# Patient Record
Sex: Male | Born: 1975 | Race: White | Hispanic: No | Smoking: Never smoker
Health system: Southern US, Community
[De-identification: ages and names within clinical notes are randomized; demographics above are authoritative.]

## PROBLEM LIST (undated history)

## (undated) DIAGNOSIS — F429 Obsessive-compulsive disorder, unspecified: Secondary | ICD-10-CM

## (undated) DIAGNOSIS — F32A Depression, unspecified: Secondary | ICD-10-CM

## (undated) DIAGNOSIS — R519 Headache, unspecified: Secondary | ICD-10-CM

## (undated) DIAGNOSIS — F419 Anxiety disorder, unspecified: Secondary | ICD-10-CM

## (undated) HISTORY — DX: Headache, unspecified: R51.9

## (undated) HISTORY — DX: Anxiety disorder, unspecified: F41.9

## (undated) HISTORY — PX: HERNIA REPAIR: SHX51

## (undated) HISTORY — DX: Depression, unspecified: F32.A

## (undated) HISTORY — DX: Obsessive-compulsive disorder, unspecified: F42.9

---

## 2005-11-12 ENCOUNTER — Emergency Department (HOSPITAL_COMMUNITY): Admission: EM | Admit: 2005-11-12 | Discharge: 2005-11-12 | Payer: Self-pay | Admitting: Emergency Medicine

## 2012-06-17 ENCOUNTER — Emergency Department (HOSPITAL_BASED_OUTPATIENT_CLINIC_OR_DEPARTMENT_OTHER)
Admission: EM | Admit: 2012-06-17 | Discharge: 2012-06-17 | Disposition: A | Discharge status: Home / Self Care | Payer: BC Managed Care – PPO | Attending: Emergency Medicine | Admitting: Emergency Medicine

## 2012-06-17 ENCOUNTER — Encounter (HOSPITAL_BASED_OUTPATIENT_CLINIC_OR_DEPARTMENT_OTHER): Payer: Self-pay | Admitting: *Deleted

## 2012-06-17 DIAGNOSIS — R109 Unspecified abdominal pain: Secondary | ICD-10-CM | POA: Insufficient documentation

## 2012-06-17 DIAGNOSIS — L089 Local infection of the skin and subcutaneous tissue, unspecified: Secondary | ICD-10-CM

## 2012-06-17 DIAGNOSIS — R1033 Periumbilical pain: Secondary | ICD-10-CM

## 2012-06-17 MED ORDER — CEPHALEXIN 500 MG PO CAPS: 500.0000 mg | ORAL_CAPSULE | Freq: Four times a day (QID) | ORAL | Status: AC

## 2012-06-17 NOTE — ED Notes (Signed)
MD at bedside. 

## 2012-06-17 NOTE — ED Provider Notes (Signed)
History     CSN: [id number]  Arrival date & time 06/17/12  1831   First MD Initiated Contact with Patient 06/17/12 1850      Chief Complaint  Patient presents with  . Abdominal Pain    (Consider location/radiation/quality/duration/timing/severity/associated sxs/prior treatment) HPI Patient presents with three-day history of umbilical pain discomfort.  He went to his local doctor who sent him here for evaluation for possible umbilical hernia.  Patient hasn't had no abdominal swelling.  He said normal bowel movements and normal flatus.  Patient denies fever or chills.  Denies dysuria.  Has no pertinent medical history.  His appetite has been good.  The pain does not increase with straining. History reviewed. No pertinent past medical history.  Past Surgical History  Procedure Date  . Hernia repair     No family history on file.  History  Substance Use Topics  . Smoking status: Never Smoker   . Smokeless tobacco: Not on file  . Alcohol Use: No      Review of Systems  All other systems reviewed and are negative.    Allergies  Review of patient's allergies indicates no known allergies.  Home Medications   Current Outpatient Rx  Name Route Sig Dispense Refill  . CEPHALEXIN 500 MG PO CAPS Oral Take 1 capsule (500 mg total) by mouth 4 (four) times daily. 28 capsule 0    Pulse 67  Temp 97.5 F (36.4 C) (Oral)  Resp 20  SpO2 99%  Physical Exam  Nursing note and vitals reviewed. Constitutional: He is oriented to person, place, and time. He appears well-developed. No distress.  HENT:  Head: Normocephalic and atraumatic.  Eyes: Pupils are equal, round, and reactive to light.  Neck: Normal range of motion.  Cardiovascular: Normal rate and intact distal pulses.   Pulmonary/Chest: No respiratory distress.  Abdominal: Soft. Normal appearance and bowel sounds are normal. He exhibits no distension and no mass. There is no tenderness. There is no rebound. No hernia.     Musculoskeletal: Normal range of motion.  Neurological: He is alert and oriented to person, place, and time. No cranial nerve deficit.  Skin: Skin is warm and dry. No rash noted.  Psychiatric: He has a normal mood and affect. His behavior is normal.    ED Course  Procedures (including critical care time)  Labs Reviewed - No data to display No results found.   1. Umbilical pain   2. Skin infection       MDM   Examination leads me to feel like this is a skin infection in his umbilicus rather than an umbilical hernia.  Plan at this time is to have him clean the area with peroxide twice daily along with topical per plan by appointment.  I am going to place him on antibiotics with strict instructions to return should his pain worsen or he develop vomiting or fever.       Nelia Shi, MD 06/17/12 Windell Moment

## 2012-06-17 NOTE — ED Notes (Signed)
Was sent from [facility] to r/o incarcerated umbilical hernia.  Pain in his abdomen x 3 days.

## 2013-06-28 ENCOUNTER — Ambulatory Visit (INDEPENDENT_AMBULATORY_CARE_PROVIDER_SITE_OTHER): Payer: BC Managed Care – PPO | Admitting: Surgery

## 2013-06-28 ENCOUNTER — Encounter (INDEPENDENT_AMBULATORY_CARE_PROVIDER_SITE_OTHER): Payer: Self-pay | Admitting: Surgery

## 2013-06-28 ENCOUNTER — Telehealth (INDEPENDENT_AMBULATORY_CARE_PROVIDER_SITE_OTHER): Payer: Self-pay | Admitting: General Surgery

## 2013-06-28 VITALS — BP 140/70 | HR 68 | Resp 16 | Ht 67.0 in | Wt 152.6 lb

## 2013-06-28 DIAGNOSIS — K645 Perianal venous thrombosis: Secondary | ICD-10-CM

## 2013-06-28 MED ORDER — LIDOCAINE HCL 2 % EX GEL: CUTANEOUS | Status: AC

## 2013-06-28 MED ORDER — OXYCODONE-ACETAMINOPHEN 5-325 MG PO TABS: 1.0000 | ORAL_TABLET | ORAL | Status: DC | PRN

## 2013-06-28 NOTE — Patient Instructions (Signed)
Hemorrhoidectomy °Care After °Hemorrhoidectomy is the removal of enlarged (dilated) veins around the rectum. Until the surgical areas are healed, control of pain and avoiding constipation are the greatest challenges for patients.  °For as long as 24 hours after receiving an anesthetic (the medication that made you sleep), and while taking narcotic pain relievers, you may feel dizzy, weak and drowsy. For that reason, the following information applies to the first 24-hour period following surgery, and continues for as long as you are taking narcotic pain medications. °· Do not drive a car, ride a bicycle, participate in activities in which you could be hurt. Do not take public transportation until you are off narcotic pain medications and until your caregiver says it is okay. °· Do not drink alcohol, take tranquilizers, or medications not prescribed or allowed by your surgical caregiver. °· Do not sign important papers or contracts for at least 24 hours or while taking narcotic medications. °· Have a responsible person with you for 24 hours. °RISKS AND COMPLICATIONS °Some problems that may occur following this procedure include: °· Infection. A germ starts growing in the tissue surrounding the site operated on. This can usually be treated with antibiotics. °· Damage to the rectal sphincter could occur. This is the muscle that opens in your anus to allow a bowel movement. This could cause incontinence. This is uncommon. °· Bleeding following surgery can be a complication of almost any surgery. Your surgeon takes every precaution to keep this from happening. °· Complications of anesthesia. °HOME CARE INSTRUCTIONS °· Avoid straining when having bowel movements. °· Avoid heavy lifting (more than 10 pounds (4.5 kilograms)). °· Only take over-the-counter or prescription medicines for pain, discomfort, or fever as directed by your caregiver. °· Take hot sitz baths for 20 to 30 minutes, 3 to 4 times per day. °· To keep  swelling down, apply an ice pack for twenty minutes three to four times per day between sitz baths. Use a towel between your skin and the ice pack. Do not do this if it causes too much discomfort. °· Keep anal area clean and dry. Following a bowel movement, you can gently wash the area with tucks (available for purchase at a drugstore) or cotton swabs. Gently pat the area dry. Do not rub the area. °· Eat a well balanced diet and drink 6 to 8 glasses of water every day to avoid constipation. A bulk laxative may be also be helpful. °SEEK MEDICAL CARE IF:  °· You have increasing pain or tenderness near or in the surgical site. °· You are unable to eat or drink. °· You develop nausea or vomiting. °· You develop uncontrolled bleeding such as soaking two to three pads in one hour. °· You have constipation, not helped by changing your diet or increasing your fluid intake. Pain medications are a common cause of constipation. °· You have pain and redness (inflammation) extending outside the area of your surgery. °· You develop an unexplained oral temperature above 102° F (38.9° C), or any other signs of infection. °· You have any other questions or concerns following surgery. °Document Released: 01/10/2004 Document Revised: 01/12/2012 Document Reviewed: 04/09/2009 °[employer]® Patient Information ©2014 [employer]. ° °

## 2013-06-28 NOTE — Progress Notes (Signed)
Subjective:     Patient ID: Roger Robertson, male   DOB: 11-03-1976, 37 y.o.   MRN: [id number]  HPIpatient presents today with thrombosed external hemorrhoid. He developed pain week ago in his anal canal. The pain worsened. It was associated with rectal bleeding. He was seen Friday at urgent care where he was diagnosed with a thrombosed external hemorrhoid and this was operated on the office. Over the weekend, he developed more pain, bleeding and swelling. He presents today for reevaluation of thrombosed external hemorrhoid.   Review of Systems  Gastrointestinal: Positive for anal bleeding and rectal pain.  Musculoskeletal: Negative.   Skin: Negative.   Hematological: Negative.        Objective:   Physical Exam  HENT:  Head: Normocephalic and atraumatic.  Genitourinary:     Skin: Skin is warm and dry.  Psychiatric: He has a normal mood and affect. His behavior is normal. Judgment and thought content normal.       Assessment:     Thrombosed external hemorrhoid status post failed incision and drainage    Plan:     Recommended excision of external hemorrhoid which is thrombosed. Pros, cons and alternative therapies discussed. Observation versus excision discussed. Risk of bleeding, infection, and other operations discussed. He wished to proceed. The patient was placed left side down. The complexes in the right lateral position. This was prepped with chlorhexidine. One percent lidocaine was used and 10 cc was injected around the complex. It was inflamed. The complex was excised in its entirety. Small clots were noted. The patient tolerated the procedure well. Instructions and medication prescriptions given. Return to work on Friday. Return to office as needed.

## 2013-06-28 NOTE — Telephone Encounter (Signed)
He call saying he was having poor pain control after excison of a thrombosed external hemorrhoid today.  He has been taking one oxycodone tablet every 4 hours.  I told him he could take two tablets every 3-4 hours, start taking Ibuprofen 600 mg every 6 hours, and put an ice pack on the area.  If this does not give him relief, I told him to call the office tomorrow morning.

## 2013-06-29 ENCOUNTER — Telehealth (INDEPENDENT_AMBULATORY_CARE_PROVIDER_SITE_OTHER): Payer: Self-pay

## 2013-06-29 NOTE — Telephone Encounter (Signed)
Pt called asking if he should still take 2 percocet every 3-4 hours along with 600 mg ibuprofen every 6 hours as directed by Dr. Abbey Chatters last night.  I suggested that if he is still in pain he should do so, as well as apply an ice pack to the area.  Pt inquired about an antibiotic just in case he develops an infection.  I let pt know that unless he is showing signs of infection we do not generally prescribe antibx for hem.  Told pt if he starts showing signs of infections such as fever or drainage to call our office for an urgent office appointment.  Pt verbalized understanding.

## 2014-01-13 ENCOUNTER — Encounter (INDEPENDENT_AMBULATORY_CARE_PROVIDER_SITE_OTHER): Payer: BC Managed Care – PPO | Admitting: Surgery

## 2016-08-21 ENCOUNTER — Encounter (HOSPITAL_COMMUNITY): Payer: Self-pay | Admitting: Emergency Medicine

## 2016-08-21 DIAGNOSIS — R51 Headache: Secondary | ICD-10-CM | POA: Insufficient documentation

## 2016-08-21 DIAGNOSIS — Z7982 Long term (current) use of aspirin: Secondary | ICD-10-CM | POA: Insufficient documentation

## 2016-08-21 LAB — CBC WITH DIFFERENTIAL/PLATELET
BASOS PCT: 0 %
Basophils Absolute: 0 10*3/uL (ref 0.0–0.1)
Eosinophils Absolute: 0.3 10*3/uL (ref 0.0–0.7)
Eosinophils Relative: 3 %
HEMATOCRIT: 44.5 % (ref 39.0–52.0)
HEMOGLOBIN: 15.6 g/dL (ref 13.0–17.0)
LYMPHS PCT: 34 %
Lymphs Abs: 2.7 10*3/uL (ref 0.7–4.0)
MCH: 30.8 pg (ref 26.0–34.0)
MCHC: 35.1 g/dL (ref 30.0–36.0)
MCV: 87.8 fL (ref 78.0–100.0)
MONOS PCT: 6 %
Monocytes Absolute: 0.5 10*3/uL (ref 0.1–1.0)
NEUTROS ABS: 4.5 10*3/uL (ref 1.7–7.7)
NEUTROS PCT: 57 %
Platelets: 280 10*3/uL (ref 150–400)
RBC: 5.07 MIL/uL (ref 4.22–5.81)
RDW: 12.9 % (ref 11.5–15.5)
WBC: 8 10*3/uL (ref 4.0–10.5)

## 2016-08-21 LAB — BASIC METABOLIC PANEL
ANION GAP: 6 (ref 5–15)
BUN: 21 mg/dL — ABNORMAL HIGH (ref 6–20)
CHLORIDE: 103 mmol/L (ref 101–111)
CO2: 29 mmol/L (ref 22–32)
CREATININE: 1.25 mg/dL — AB (ref 0.61–1.24)
Calcium: 9.6 mg/dL (ref 8.9–10.3)
GFR calc non Af Amer: >60 mL/min (ref >60)
Glucose, Bld: 92 mg/dL (ref 65–99)
Potassium: 4 mmol/L (ref 3.5–5.1)
Sodium: 138 mmol/L (ref 135–145)

## 2016-08-21 NOTE — ED Triage Notes (Signed)
Pt. reports intermittent headache with nausea for 3 weeks unrelieved by prescription Sumatripan , he has an appointment wit a neurologist in November.

## 2016-08-22 ENCOUNTER — Emergency Department (HOSPITAL_COMMUNITY)
Admission: EM | Admit: 2016-08-22 | Discharge: 2016-08-22 | Disposition: A | Discharge status: Home / Self Care | Payer: BLUE CROSS/BLUE SHIELD | Attending: Emergency Medicine | Admitting: Emergency Medicine

## 2016-08-22 ENCOUNTER — Emergency Department (HOSPITAL_COMMUNITY): Payer: BLUE CROSS/BLUE SHIELD

## 2016-08-22 DIAGNOSIS — R51 Headache: Secondary | ICD-10-CM

## 2016-08-22 DIAGNOSIS — R519 Headache, unspecified: Secondary | ICD-10-CM

## 2016-08-22 MED ORDER — DEXAMETHASONE SODIUM PHOSPHATE 10 MG/ML IJ SOLN
10.0000 mg | Freq: Once | INTRAMUSCULAR | Status: AC
  Administered 2016-08-22: 10 mg via INTRAVENOUS
  Filled 2016-08-22: qty 1

## 2016-08-22 MED ORDER — PROCHLORPERAZINE EDISYLATE 5 MG/ML IJ SOLN
10.0000 mg | Freq: Once | INTRAMUSCULAR | Status: AC
  Administered 2016-08-22: 10 mg via INTRAVENOUS
  Filled 2016-08-22: qty 2

## 2016-08-22 MED ORDER — SODIUM CHLORIDE 0.9 % IV BOLUS (SEPSIS)
1000.0000 mL | Freq: Once | INTRAVENOUS | Status: AC
  Administered 2016-08-22: 1000 mL via INTRAVENOUS

## 2016-08-22 MED ORDER — KETOROLAC TROMETHAMINE 30 MG/ML IJ SOLN
30.0000 mg | Freq: Once | INTRAMUSCULAR | Status: AC
  Administered 2016-08-22: 30 mg via INTRAVENOUS
  Filled 2016-08-22: qty 1

## 2016-08-22 MED ORDER — DIPHENHYDRAMINE HCL 50 MG/ML IJ SOLN
12.5000 mg | Freq: Once | INTRAMUSCULAR | Status: AC
  Administered 2016-08-22: 12.5 mg via INTRAVENOUS
  Filled 2016-08-22: qty 1

## 2016-08-22 NOTE — ED Notes (Signed)
Patient transported to CT 

## 2016-08-22 NOTE — ED Notes (Signed)
Pt returned from CT °

## 2016-08-22 NOTE — ED Provider Notes (Signed)
[facility]-EMERGENCY DEPT Provider Note   CSN: [id number] Arrival date & time: 08/21/16  2105     History   Chief Complaint Chief Complaint  Patient presents with  . Headache  . Nausea    HPI Roger Robertson is a 40 y.o. male with no major medical problems presents to the Emergency Department complaining of gradual, persistent,waxing and waning generalized headache onset 2 weeks ago.  Pt reports he does not regularly get headaches.  No diagnosis of migraine.  Pt denies vision changes. Associated symptoms include nausea.  Nothing makes it better and light makes it worse.  Pt denies fever, chills, neck pain, chest pain, abd pain, syncope.   Pt reports he has been seeing his PCP.  He was given sumatriptan which made the pain worse.  No CT has been performed.     The history is provided by the patient and medical records. No language interpreter was used.    History reviewed. No pertinent past medical history.  There are no active problems to display for this patient.   Past Surgical History:  Procedure Laterality Date  . HERNIA REPAIR         Home Medications    Prior to Admission medications   Medication Sig Start Date End Date Taking? Authorizing Provider  aspirin-acetaminophen-caffeine (EXCEDRIN MIGRAINE) [phone] MG tablet Take 1 tablet by mouth every 6 (six) hours as needed for headache.   Yes Historical Provider, MD  SUMAtriptan (IMITREX) 50 MG tablet Take 50 mg by mouth every 2 (two) hours as needed for migraine or headache.  08/19/16  Yes Historical Provider, MD    Family History No family history on file.  Social History Social History  Substance Use Topics  . Smoking status: Never Smoker  . Smokeless tobacco: Never Used  . Alcohol use No     Allergies   Review of patient's allergies indicates no known allergies.   Review of Systems Review of Systems  Neurological: Positive for headaches.  All other systems reviewed and are negative.    Physical  Exam Updated Vital Signs BP 143/95   Pulse 79   Temp 98.3 F (36.8 C) (Oral)   Resp 18   Ht 5\' 7"  (1.702 m)   Wt 78.9 kg   SpO2 99%   BMI 27.25 kg/m   Physical Exam  Constitutional: He is oriented to person, place, and time. He appears well-developed and well-nourished. No distress.  HENT:  Head: Normocephalic and atraumatic.  Mouth/Throat: Oropharynx is clear and moist.  Eyes: Conjunctivae and EOM are normal. Pupils are equal, round, and reactive to light. No scleral icterus.  No horizontal, vertical or rotational nystagmus  Neck: Normal range of motion. Neck supple.  Full active and passive ROM without pain No midline or paraspinal tenderness No nuchal rigidity or meningeal signs  Cardiovascular: Normal rate, regular rhythm and intact distal pulses.   Pulmonary/Chest: Effort normal and breath sounds normal. No respiratory distress. He has no wheezes. He has no rales.  Abdominal: Soft. Bowel sounds are normal. There is no tenderness. There is no rebound and no guarding.  Musculoskeletal: Normal range of motion.  Lymphadenopathy:    He has no cervical adenopathy.  Neurological: He is alert and oriented to person, place, and time. He has normal reflexes. No cranial nerve deficit. He exhibits normal muscle tone. Coordination normal.  Mental Status:  Alert, oriented, thought content appropriate. Speech fluent without evidence of aphasia. Able to follow 2 step commands without difficulty.  Cranial Nerves:  II:  Peripheral visual fields grossly normal, pupils equal, round, reactive to light III,IV, VI: ptosis not present, extra-ocular motions intact bilaterally  V,VII: smile symmetric, facial light touch sensation equal VIII: hearing grossly normal bilaterally  IX,X: midline uvula rise  XI: bilateral shoulder shrug equal and strong XII: midline tongue extension  Motor:  5/5 in upper and lower extremities bilaterally including strong and equal grip strength and  dorsiflexion/plantar flexion Sensory: Pinprick and light touch normal in all extremities.  Deep Tendon Reflexes: 2+ and symmetric  Cerebellar: normal finger-to-nose with bilateral upper extremities Gait: normal gait and balance CV: distal pulses palpable throughout   Skin: Skin is warm and dry. No rash noted. He is not diaphoretic.  Psychiatric: He has a normal mood and affect. His behavior is normal. Judgment and thought content normal.  Nursing note and vitals reviewed.    ED Treatments / Results  Labs (all labs ordered are listed, but only abnormal results are displayed) Labs Reviewed  BASIC METABOLIC PANEL - Abnormal; Notable for the following:       Result Value   BUN 21 (*)    Creatinine, Ser 1.25 (*)    All other components within normal limits  CBC WITH DIFFERENTIAL/PLATELET    EKG  EKG Interpretation None       Radiology Ct Head Wo Contrast  Result Date: 08/22/2016 CLINICAL DATA:  Initial evaluation for acute headache for 2 weeks. EXAM: CT HEAD WITHOUT CONTRAST TECHNIQUE: Contiguous axial images were obtained from the base of the skull through the vertex without intravenous contrast. COMPARISON:  None available. FINDINGS: Brain: No acute intracranial hemorrhage. No evidence for acute or subacute ischemia. No mass lesion, midline shift or mass effect. No hydrocephalus. No extra-axial fluid collection. Vascular: No hyperdense vessel. Skull: Scalp soft tissues within normal limits.  Calvarium intact. Sinuses/Orbits: Globes and orbital soft tissues within normal limits. Paranasal sinuses are clear. No mastoid effusion. IMPRESSION: Normal head CT.  No acute intracranial process identified. Electronically Signed   By: Rise Mu M.D.   On: 08/22/2016 05:05    Procedures Procedures (including critical care time)  Medications Ordered in ED Medications  ketorolac (TORADOL) 30 MG/ML injection 30 mg (not administered)  dexamethasone (DECADRON) injection 10 mg (not  administered)  sodium chloride 0.9 % bolus 1,000 mL (1,000 mLs Intravenous New Bag/Given 08/22/16 0350)  prochlorperazine (COMPAZINE) injection 10 mg (10 mg Intravenous Given 08/22/16 0351)  diphenhydrAMINE (BENADRYL) injection 12.5 mg (12.5 mg Intravenous Given 08/22/16 0351)     Initial Impression / Assessment and Plan / ED Course  I have reviewed the triage vital signs and the nursing notes.  Pertinent labs & imaging results that were available during my care of the patient were reviewed by me and considered in my medical decision making (see chart for details).  Clinical Course  Value Comment By Time  WBC: 8.0 Screening labs reassuring. Creatinine slightly elevated at 1.25.  No old value for comparison. Dahlia Client Sonnie Pawloski, PA-C 10/20 0541  CT Head Wo Contrast No acute abnormality. No masses.   [facility]ia Client Derriana Oser, PA-C 10/20 0542   Pt reports his headache is better. [facility]ia Client Krishana Lutze, PA-C 10/20 0542   Pt with headache.  Pt HA treated and improved while in ED.  Presentation is non concerning for [facility], ICH, Meningitis, or temporal arteritis. Pt is afebrile with no focal neuro deficits, nuchal rigidity, or change in vision. Discussed normal CT scan findings with patient. Pt is to follow up with PCP to discuss further medication and  testing. Pt verbalizes understanding and is agreeable with plan to dc.    Final Clinical Impressions(s) / ED Diagnoses   Final diagnoses:  Nonintractable headache, unspecified chronicity pattern, unspecified headache type    New Prescriptions Current Discharge Medication List       Dierdre ForthHannah Barbee Mamula, PA-C 08/22/16 0544    Layla MawKristen N Ward, DO 08/22/16 [phone]

## 2016-08-22 NOTE — ED Notes (Signed)
Patient ambulated to BR with even, steady gait.

## 2017-09-21 IMAGING — CT CT HEAD W/O CM
4 series · 16 of 47 positions shown, 18 images · non-contrast
Comparison: None available.

CLINICAL DATA: Initial evaluation for acute headache for 2 weeks.

EXAM:
CT HEAD WITHOUT CONTRAST
TECHNIQUE: Contiguous axial images were obtained from the base of the skull
through the vertex without intravenous contrast.

[Series 2: head without · axial · non-contrast · 0.45mm/px · z∈[-113,+2]mm · 7 of 31 slices shown, 9 images]
[im 4/31  brain]
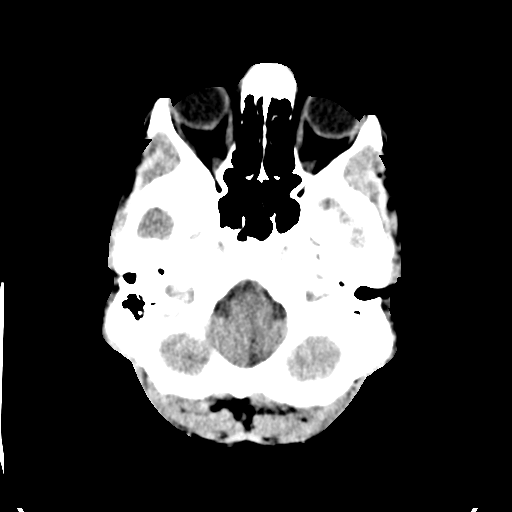
[im 4/31  bone]
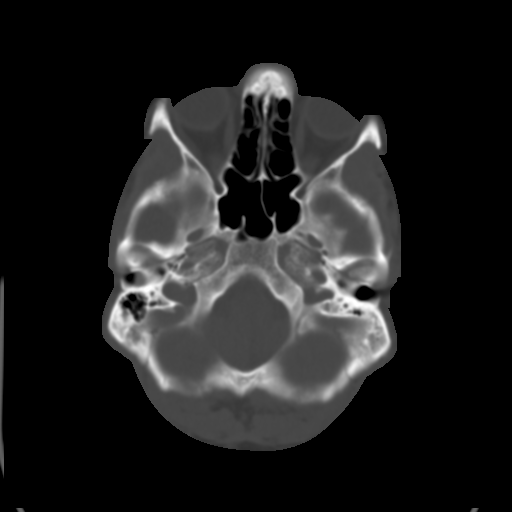
[im 8/31  brain]
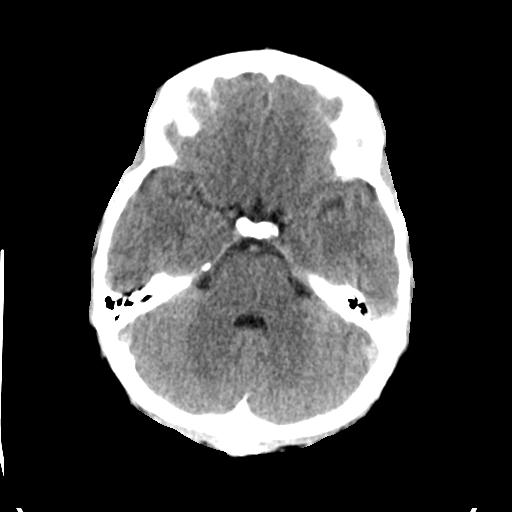
[im 12/31  brain]
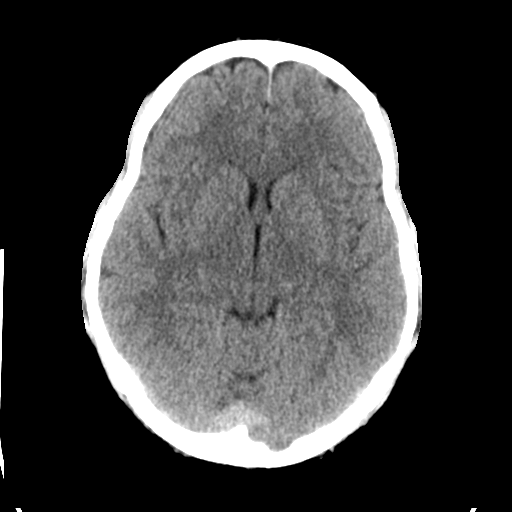
[im 16/31  brain]
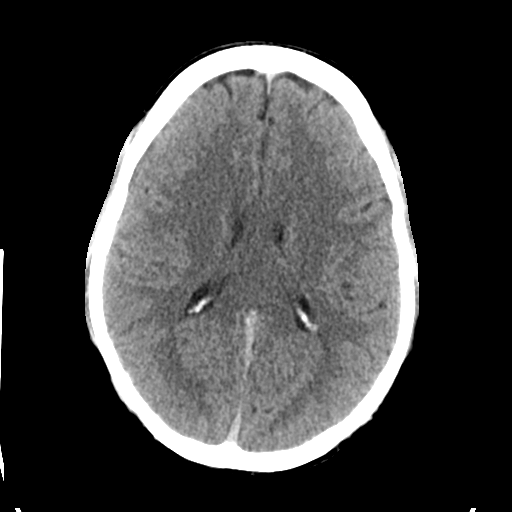
[im 19/31  brain]
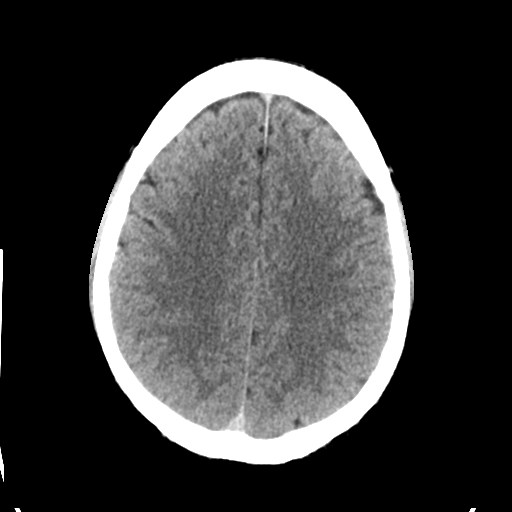
[im 19/31  bone]
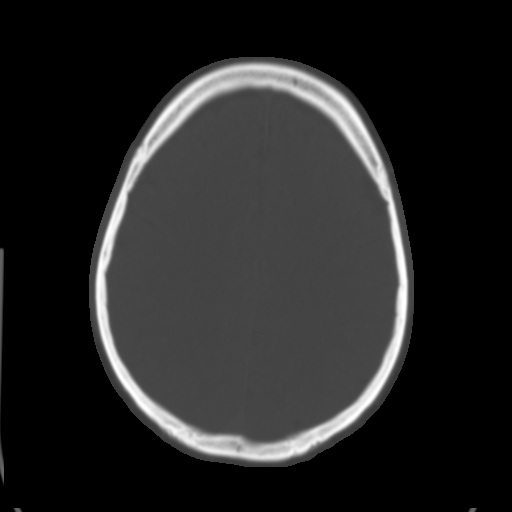
[im 23/31  brain]
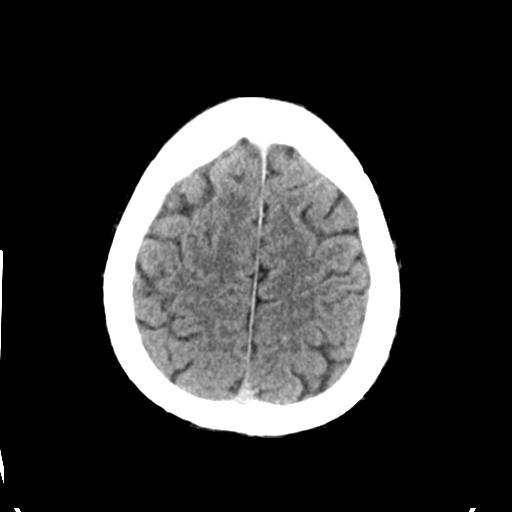
[im 27/31  brain]
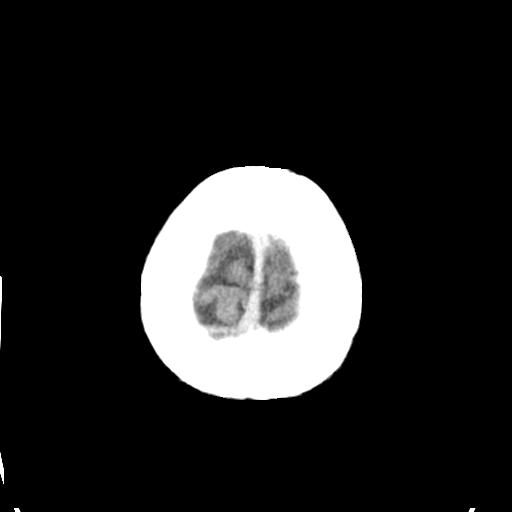

[Series 3: head bone · axial · 0.45mm/px · z∈[-114,-84]mm · 3 of 77 slices shown]
[im 8/77  bone]
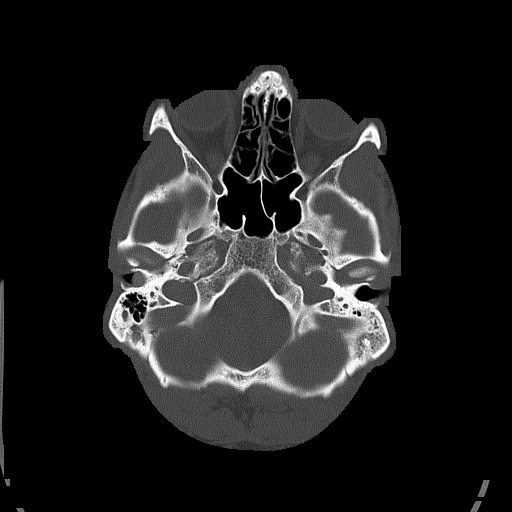
[im 16/77  bone]
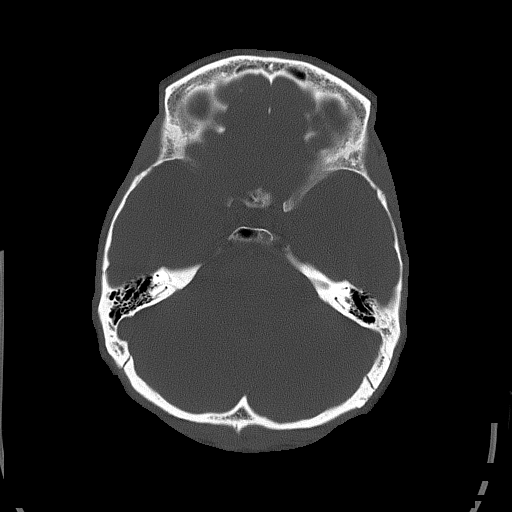
[im 23/77  bone]
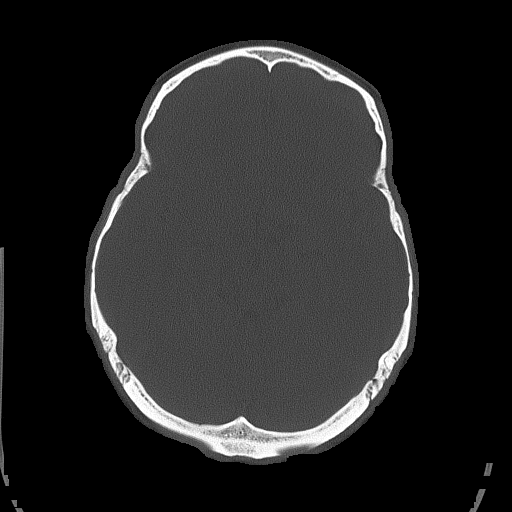

[Series 4: head without cor · coronal · non-contrast · 0.32mm/px · 3 of 66 slices shown]
[im 22/66  brain]
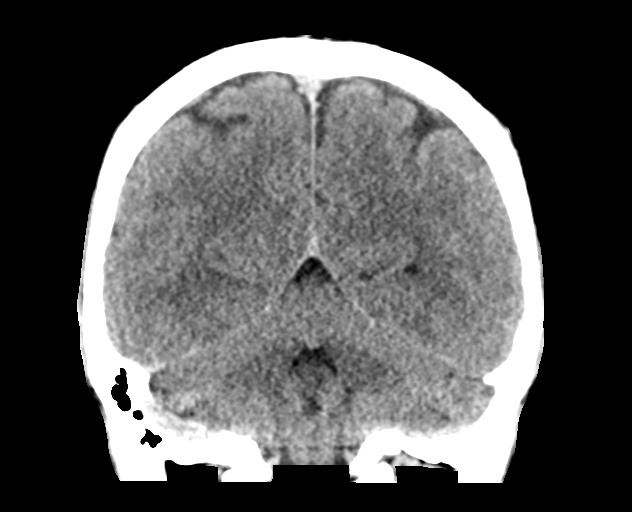
[im 29/66  brain]
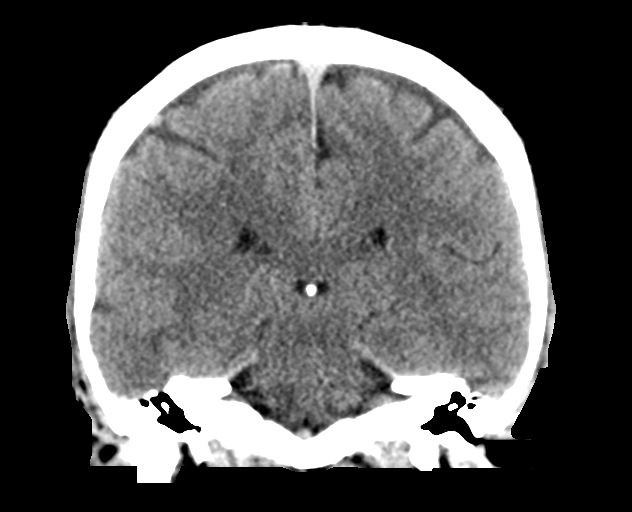
[im 37/66  brain]
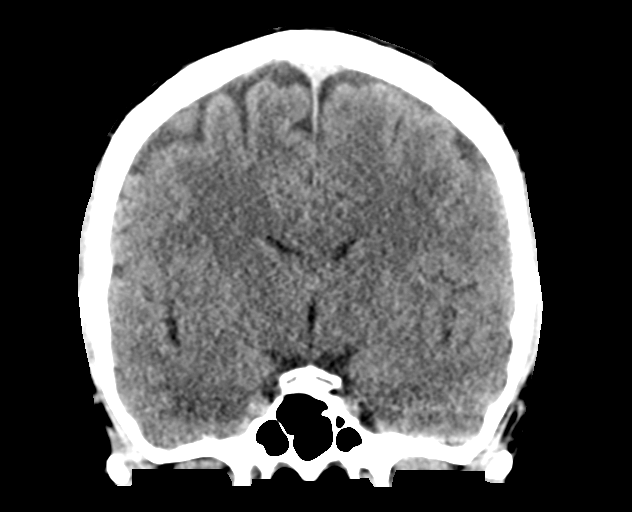

[Series 5: head without sag · sagittal · non-contrast · 0.33mm/px · 3 of 52 slices shown]
[im 18/52  brain]
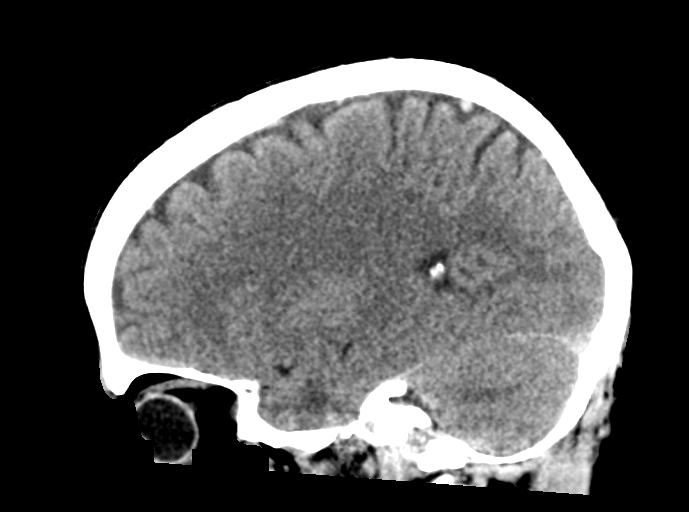
[im 26/52  brain]
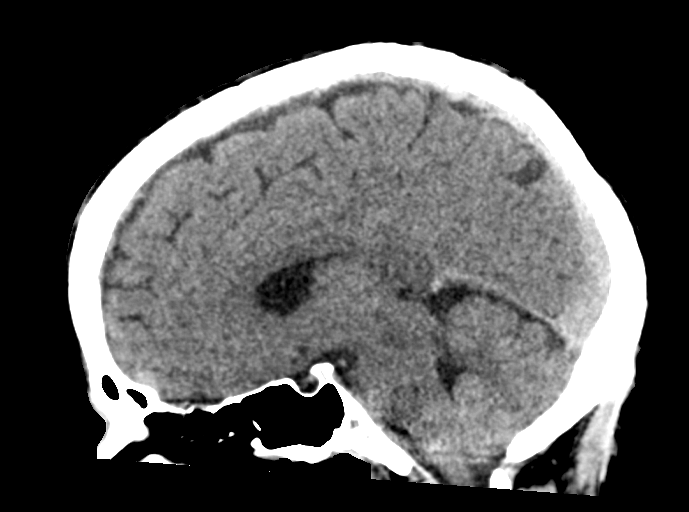
[im 35/52  brain]
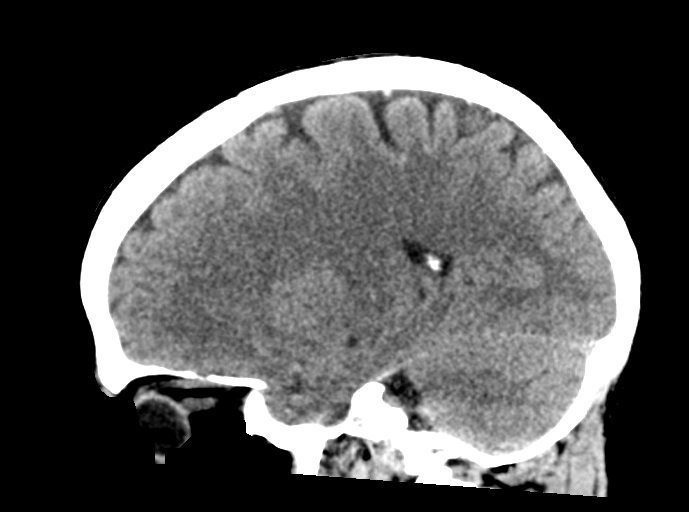

[16 of 47 positions shown; findings below may reference images not displayed]

FINDINGS: Brain: No acute intracranial hemorrhage. No evidence for acute or
subacute ischemia. No mass lesion, midline shift or mass effect. No
hydrocephalus. No extra-axial fluid collection.

Vascular: No hyperdense vessel.

Skull: Scalp soft tissues within normal limits.  Calvarium intact.

Sinuses/Orbits: Globes and orbital soft tissues within normal
limits. Paranasal sinuses are clear. No mastoid effusion.
IMPRESSION: Normal head CT.  No acute intracranial process identified.

## 2020-06-06 ENCOUNTER — Other Ambulatory Visit: Payer: Self-pay

## 2020-06-06 ENCOUNTER — Encounter (HOSPITAL_BASED_OUTPATIENT_CLINIC_OR_DEPARTMENT_OTHER): Payer: Self-pay | Admitting: *Deleted

## 2020-06-06 ENCOUNTER — Emergency Department (HOSPITAL_BASED_OUTPATIENT_CLINIC_OR_DEPARTMENT_OTHER)
Admission: EM | Admit: 2020-06-06 | Discharge: 2020-06-06 | Disposition: A | Discharge status: Home / Self Care | Payer: BC Managed Care – PPO | Attending: Emergency Medicine | Admitting: Emergency Medicine

## 2020-06-06 DIAGNOSIS — R05 Cough: Secondary | ICD-10-CM | POA: Insufficient documentation

## 2020-06-06 DIAGNOSIS — Z7982 Long term (current) use of aspirin: Secondary | ICD-10-CM | POA: Diagnosis not present

## 2020-06-06 DIAGNOSIS — Z79899 Other long term (current) drug therapy: Secondary | ICD-10-CM | POA: Insufficient documentation

## 2020-06-06 DIAGNOSIS — U071 COVID-19: Secondary | ICD-10-CM | POA: Insufficient documentation

## 2020-06-06 DIAGNOSIS — R197 Diarrhea, unspecified: Secondary | ICD-10-CM | POA: Diagnosis not present

## 2020-06-06 DIAGNOSIS — R112 Nausea with vomiting, unspecified: Secondary | ICD-10-CM | POA: Diagnosis present

## 2020-06-06 DIAGNOSIS — R519 Headache, unspecified: Secondary | ICD-10-CM | POA: Insufficient documentation

## 2020-06-06 LAB — COMPREHENSIVE METABOLIC PANEL
ALT: 51 U/L — ABNORMAL HIGH (ref 0–44)
AST: 49 U/L — ABNORMAL HIGH (ref 15–41)
Albumin: 3.8 g/dL (ref 3.5–5.0)
Alkaline Phosphatase: 84 U/L (ref 38–126)
Anion gap: 11 (ref 5–15)
BUN: 13 mg/dL (ref 6–20)
CO2: 25 mmol/L (ref 22–32)
Calcium: 8.4 mg/dL — ABNORMAL LOW (ref 8.9–10.3)
Chloride: 101 mmol/L (ref 98–111)
Creatinine, Ser: 1.12 mg/dL (ref 0.61–1.24)
GFR calc Af Amer: >60 mL/min (ref >60)
GFR calc non Af Amer: >60 mL/min (ref >60)
Glucose, Bld: 100 mg/dL — ABNORMAL HIGH (ref 70–99)
Potassium: 4.1 mmol/L (ref 3.5–5.1)
Sodium: 137 mmol/L (ref 135–145)
Total Bilirubin: 0.5 mg/dL (ref 0.3–1.2)
Total Protein: 7.1 g/dL (ref 6.5–8.1)

## 2020-06-06 LAB — CBC WITH DIFFERENTIAL/PLATELET
Abs Immature Granulocytes: 0.01 10*3/uL (ref 0.00–0.07)
Basophils Absolute: 0 10*3/uL (ref 0.0–0.1)
Basophils Relative: 1 %
Eosinophils Absolute: 0 10*3/uL (ref 0.0–0.5)
Eosinophils Relative: 0 %
HCT: 48.5 % (ref 39.0–52.0)
Hemoglobin: 16.6 g/dL (ref 13.0–17.0)
Immature Granulocytes: 0 %
Lymphocytes Relative: 30 %
Lymphs Abs: 1.1 10*3/uL (ref 0.7–4.0)
MCH: 30.3 pg (ref 26.0–34.0)
MCHC: 34.2 g/dL (ref 30.0–36.0)
MCV: 88.5 fL (ref 80.0–100.0)
Monocytes Absolute: 0.5 10*3/uL (ref 0.1–1.0)
Monocytes Relative: 14 %
Neutro Abs: 2 10*3/uL (ref 1.7–7.7)
Neutrophils Relative %: 55 %
Platelets: 191 10*3/uL (ref 150–400)
RBC: 5.48 MIL/uL (ref 4.22–5.81)
RDW: 12.9 % (ref 11.5–15.5)
WBC: 3.5 10*3/uL — ABNORMAL LOW (ref 4.0–10.5)
nRBC: 0 % (ref 0.0–0.2)

## 2020-06-06 MED ORDER — DIPHENHYDRAMINE HCL 50 MG/ML IJ SOLN
25.0000 mg | Freq: Once | INTRAMUSCULAR | Status: AC
  Administered 2020-06-06: 25 mg via INTRAVENOUS
  Filled 2020-06-06: qty 1

## 2020-06-06 MED ORDER — SODIUM CHLORIDE 0.9 % IV BOLUS
1000.0000 mL | Freq: Once | INTRAVENOUS | Status: AC
Start: 2020-06-06 — End: 2020-06-06
  Administered 2020-06-06: 1000 mL via INTRAVENOUS

## 2020-06-06 MED ORDER — ONDANSETRON HCL 4 MG PO TABS: 4.0000 mg | ORAL_TABLET | Freq: Four times a day (QID) | ORAL | 0 refills | Status: AC | PRN

## 2020-06-06 MED ORDER — LACTATED RINGERS IV BOLUS
1000.0000 mL | Freq: Once | INTRAVENOUS | Status: AC
  Administered 2020-06-06: 1000 mL via INTRAVENOUS

## 2020-06-06 MED ORDER — KETOROLAC TROMETHAMINE 15 MG/ML IJ SOLN
15.0000 mg | Freq: Once | INTRAMUSCULAR | Status: AC
  Administered 2020-06-06: 15 mg via INTRAVENOUS
  Filled 2020-06-06: qty 1

## 2020-06-06 MED ORDER — METOCLOPRAMIDE HCL 5 MG/ML IJ SOLN
10.0000 mg | Freq: Once | INTRAMUSCULAR | Status: AC
  Administered 2020-06-06: 10 mg via INTRAVENOUS
  Filled 2020-06-06: qty 2

## 2020-06-06 NOTE — ED Provider Notes (Signed)
[facility] EMERGENCY DEPARTMENT Provider Note   CSN: [id number] Arrival date & time: 06/06/20  1336     History Chief Complaint  Patient presents with  . Emesis    covid +    Roger Robertson is a 44 y.o. male.  HPI   44yM with n/v/d. Recent COVID diagnosis. Symptoms began 8d ago with headache. HA has persisted. In the last few days has also had persistent n/v and loose stools. Occasional cough but doesn't feel SOB. Nonsmoker. Denies underlying medical history.   History reviewed. No pertinent past medical history.  There are no problems to display for this patient.   Past Surgical History:  Procedure Laterality Date  . HERNIA REPAIR         History reviewed. No pertinent family history.  Social History   Tobacco Use  . Smoking status: Never Smoker  . Smokeless tobacco: Never Used  Substance Use Topics  . Alcohol use: No  . Drug use: No    Home Medications Prior to Admission medications   Medication Sig Start Date End Date Taking? Authorizing Provider  aspirin-acetaminophen-caffeine (EXCEDRIN MIGRAINE) [phone] MG tablet Take 1 tablet by mouth every 6 (six) hours as needed for headache.    [provider]  ondansetron (ZOFRAN) 4 MG tablet Take 1 tablet (4 mg total) by mouth every 6 (six) hours as needed for nausea or vomiting. 06/06/20   Raeford Razor, MD  SUMAtriptan (IMITREX) 50 MG tablet Take 50 mg by mouth every 2 (two) hours as needed for migraine or headache.  08/19/16   [provider]    Allergies    Patient has no known allergies.  Review of Systems   Review of Systems All systems reviewed and negative, other than as noted in HPI.  Physical Exam Updated Vital Signs BP 122/75 (BP Location: Right Arm)   Pulse 86   Temp 98.2 F (36.8 C)   Resp 17   Ht 5\' 7"  (1.702 m)   Wt 79.4 kg   SpO2 99%   BMI 27.41 kg/m   Physical Exam Vitals and nursing note reviewed.  Constitutional:      General: He is not in acute  distress.    Appearance: He is well-developed.  HENT:     Head: Normocephalic and atraumatic.  Eyes:     General:        Right eye: No discharge.        Left eye: No discharge.     Conjunctiva/sclera: Conjunctivae normal.  Cardiovascular:     Rate and Rhythm: Normal rate and regular rhythm.     Heart sounds: Normal heart sounds. No murmur heard.  No friction rub. No gallop.   Pulmonary:     Effort: Pulmonary effort is normal. No respiratory distress.     Breath sounds: Normal breath sounds.  Abdominal:     General: There is no distension.     Palpations: Abdomen is soft.     Tenderness: There is no abdominal tenderness.  Musculoskeletal:        General: No tenderness.     Cervical back: Neck supple.  Skin:    General: Skin is warm and dry.  Neurological:     Mental Status: He is alert.  Psychiatric:        Behavior: Behavior normal.        Thought Content: Thought content normal.     ED Results / Procedures / Treatments   Labs (all labs ordered are listed, but  only abnormal results are displayed) Labs Reviewed  CBC WITH DIFFERENTIAL/PLATELET - Abnormal; Notable for the following components:      Result Value   WBC 3.5 (*)    All other components within normal limits  COMPREHENSIVE METABOLIC PANEL - Abnormal; Notable for the following components:   Glucose, Bld 100 (*)    Calcium 8.4 (*)    AST 49 (*)    ALT 51 (*)    All other components within normal limits    EKG None  Radiology No results found.  Procedures Procedures (including critical care time)  Medications Ordered in ED Medications  sodium chloride 0.9 % bolus 1,000 mL (0 mLs Intravenous Stopped 06/06/20 1500)  lactated ringers bolus 1,000 mL (0 mLs Intravenous Stopped 06/06/20 2115)  ketorolac (TORADOL) 15 MG/ML injection 15 mg (15 mg Intravenous Given 06/06/20 2001)  metoCLOPramide (REGLAN) injection 10 mg (10 mg Intravenous Given 06/06/20 1959)  diphenhydrAMINE (BENADRYL) injection 25 mg (25 mg  Intravenous Given 06/06/20 2000)    ED Course  I have reviewed the triage vital signs and the nursing notes.  Pertinent labs & imaging results that were available during my care of the patient were reviewed by me and considered in my medical decision making (see chart for details).    MDM Rules/Calculators/A&P                          44yM with recent COVID diagnosis with predominantly GI symptoms. Nontoxic. Labs reassuring. Mild leukopenia and minimal elevation in LFTs but this is not surprising and expected to be transient. HD stable. Treated symptomatically. Continued quarantine, rest and symptomatic tx.   Final Clinical Impression(s) / ED Diagnoses Final diagnoses:  COVID-19 virus infection    Rx / DC Orders ED Discharge Orders         Ordered    ondansetron (ZOFRAN) 4 MG tablet  Every 6 hours PRN     Discontinue  Reprint     06/06/20 2110           Raeford Razor, MD 06/06/20 2120

## 2020-06-06 NOTE — ED Notes (Signed)
ED Provider at bedside. 

## 2020-06-06 NOTE — ED Notes (Signed)
Pt ambulated to bathroom without difficulty.

## 2020-06-06 NOTE — ED Triage Notes (Signed)
Pt c/o n/v/d x 5 days COvid +

## 2020-06-06 NOTE — Discharge Instructions (Signed)
Take zofran as needed for nausea. You can take imodium as needed for diarrhea. Try to rest and stay well hydrated.

## 2020-09-01 ENCOUNTER — Emergency Department (HOSPITAL_BASED_OUTPATIENT_CLINIC_OR_DEPARTMENT_OTHER)
Admission: EM | Admit: 2020-09-01 | Discharge: 2020-09-01 | Disposition: A | Discharge status: Home / Self Care | Payer: No Typology Code available for payment source | Attending: Emergency Medicine | Admitting: Emergency Medicine

## 2020-09-01 ENCOUNTER — Emergency Department (HOSPITAL_BASED_OUTPATIENT_CLINIC_OR_DEPARTMENT_OTHER): Payer: No Typology Code available for payment source

## 2020-09-01 ENCOUNTER — Other Ambulatory Visit: Payer: Self-pay

## 2020-09-01 ENCOUNTER — Encounter (HOSPITAL_BASED_OUTPATIENT_CLINIC_OR_DEPARTMENT_OTHER): Payer: Self-pay | Admitting: Emergency Medicine

## 2020-09-01 DIAGNOSIS — R109 Unspecified abdominal pain: Secondary | ICD-10-CM

## 2020-09-01 DIAGNOSIS — R1031 Right lower quadrant pain: Secondary | ICD-10-CM | POA: Insufficient documentation

## 2020-09-01 LAB — CBC WITH DIFFERENTIAL/PLATELET
Abs Immature Granulocytes: 0.02 10*3/uL (ref 0.00–0.07)
Basophils Absolute: 0 10*3/uL (ref 0.0–0.1)
Basophils Relative: 1 %
Eosinophils Absolute: 0.1 10*3/uL (ref 0.0–0.5)
Eosinophils Relative: 2 %
HCT: 45.8 % (ref 39.0–52.0)
Hemoglobin: 15.5 g/dL (ref 13.0–17.0)
Immature Granulocytes: 0 %
Lymphocytes Relative: 33 %
Lymphs Abs: 1.8 10*3/uL (ref 0.7–4.0)
MCH: 31.1 pg (ref 26.0–34.0)
MCHC: 33.8 g/dL (ref 30.0–36.0)
MCV: 91.8 fL (ref 80.0–100.0)
Monocytes Absolute: 0.6 10*3/uL (ref 0.1–1.0)
Monocytes Relative: 10 %
Neutro Abs: 3.1 10*3/uL (ref 1.7–7.7)
Neutrophils Relative %: 54 %
Platelets: 316 10*3/uL (ref 150–400)
RBC: 4.99 MIL/uL (ref 4.22–5.81)
RDW: 13.8 % (ref 11.5–15.5)
WBC: 5.6 10*3/uL (ref 4.0–10.5)
nRBC: 0 % (ref 0.0–0.2)

## 2020-09-01 LAB — COMPREHENSIVE METABOLIC PANEL
ALT: 44 U/L (ref 0–44)
AST: 26 U/L (ref 15–41)
Albumin: 3.9 g/dL (ref 3.5–5.0)
Alkaline Phosphatase: 49 U/L (ref 38–126)
Anion gap: 8 (ref 5–15)
BUN: 11 mg/dL (ref 6–20)
CO2: 25 mmol/L (ref 22–32)
Calcium: 9 mg/dL (ref 8.9–10.3)
Chloride: 104 mmol/L (ref 98–111)
Creatinine, Ser: 1.06 mg/dL (ref 0.61–1.24)
GFR, Estimated: >60 mL/min (ref >60)
Glucose, Bld: 90 mg/dL (ref 70–99)
Potassium: 4.5 mmol/L (ref 3.5–5.1)
Sodium: 137 mmol/L (ref 135–145)
Total Bilirubin: <0.1 mg/dL — ABNORMAL LOW (ref 0.3–1.2)
Total Protein: 6.9 g/dL (ref 6.5–8.1)

## 2020-09-01 LAB — URINALYSIS, ROUTINE W REFLEX MICROSCOPIC
Bilirubin Urine: NEGATIVE
Glucose, UA: NEGATIVE mg/dL
Hgb urine dipstick: NEGATIVE
Ketones, ur: NEGATIVE mg/dL
Leukocytes,Ua: NEGATIVE
Nitrite: NEGATIVE
Protein, ur: NEGATIVE mg/dL
Specific Gravity, Urine: 1.02 (ref 1.005–1.030)
pH: 6 (ref 5.0–8.0)

## 2020-09-01 LAB — LIPASE, BLOOD: Lipase: 31 U/L (ref 11–51)

## 2020-09-01 MED ORDER — IOHEXOL 300 MG/ML  SOLN
100.0000 mL | Freq: Once | INTRAMUSCULAR | Status: AC | PRN
  Administered 2020-09-01: 100 mL via INTRAVENOUS

## 2020-09-01 NOTE — ED Provider Notes (Signed)
[facility] EMERGENCY DEPARTMENT Provider Note   CSN: [id number] Arrival date & time: 09/01/20  8657     History Chief Complaint  Patient presents with  . Abdominal Pain    Roger Robertson is a 44 y.o. male with past medical history of hernia surgically repaired in 1984.  HPI Patient presents to emergency department today with chief complaint of right lower quadrant abdominal pain x1 week.  Patient states the pain has been constant and is worse with any kind of movement, especially changing positions.  Patient describes the pain as aching.  Pain does not radiate.  At rest he states pain is 4 /10 in severity never with any movement it increases to 10 /10.  He has been taking ibuprofen at home without any symptom improvement.  He does state that he started out the [employer] x1 week ago.  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  He states the pain started while he was sitting on the couch over the weekend, not while doing any exercising.  He denies any fever, chills, chest pain, back pain, testicle or scrotal pain, testicle or scrotal swelling, pain on discharge, nausea, emesis, gross hematuria, urinary frequency, dysuria, history of constipation.  His last bowel movement was this morning and was normal.  He went to [place] prior to arrival and was sent to ED for further evaluation of pain.    History reviewed. No pertinent past medical history.  There are no problems to display for this patient.   Past Surgical History:  Procedure Laterality Date  . HERNIA REPAIR         No family history on file.  Social History   Tobacco Use  . Smoking status: Never Smoker  . Smokeless tobacco: Never Used  Vaping Use  . Vaping Use: Never used  Substance Use Topics  . Alcohol use: No  . Drug use: No    Home Medications Prior to Admission medications   Medication Sig Start Date End Date Taking? Authorizing Provider  aspirin-acetaminophen-caffeine  (EXCEDRIN MIGRAINE) [phone] MG tablet Take 1 tablet by mouth every 6 (six) hours as needed for headache.    [provider]  ondansetron (ZOFRAN) 4 MG tablet Take 1 tablet (4 mg total) by mouth every 6 (six) hours as needed for nausea or vomiting. 06/06/20   Raeford Razor, MD  SUMAtriptan (IMITREX) 50 MG tablet Take 50 mg by mouth every 2 (two) hours as needed for migraine or headache.  08/19/16   [provider]    Allergies    Patient has no known allergies.  Review of Systems   Review of Systems All other systems are reviewed and are negative for acute change except as noted in the HPI.  Physical Exam Updated Vital Signs BP 131/86 (BP Location: Left Arm)   Pulse 69   Temp 97.8 F (36.6 C) (Oral)   Resp 18   Ht 5\' 7"  (1.702 m)   Wt 79.8 kg   SpO2 100%   BMI 27.57 kg/m   Physical Exam Vitals and nursing note reviewed.  Constitutional:      General: He is not in acute distress.    Appearance: He is not ill-appearing.  HENT:     Head: Normocephalic and atraumatic.     Right Ear: Tympanic membrane and external ear normal.     Left Ear: Tympanic membrane and external ear normal.     Nose: Nose normal.     Mouth/Throat:  Mouth: Mucous membranes are moist.     Pharynx: Oropharynx is clear.  Eyes:     General: No scleral icterus.       Right eye: No discharge.        Left eye: No discharge.     Extraocular Movements: Extraocular movements intact.     Conjunctiva/sclera: Conjunctivae normal.     Pupils: Pupils are equal, round, and reactive to light.  Neck:     Vascular: No JVD.  Cardiovascular:     Rate and Rhythm: Normal rate and regular rhythm.     Pulses: Normal pulses.          Radial pulses are 2+ on the right side and 2+ on the left side.     Heart sounds: Normal heart sounds.  Pulmonary:     Comments: Lungs clear to auscultation in all fields. Symmetric chest rise. No wheezing, rales, or rhonchi. Abdominal:     General: Bowel sounds are  normal.     Tenderness: There is abdominal tenderness in the right lower quadrant. There is no right CVA tenderness or left CVA tenderness.     Comments: Abdomen is soft, non-distended. Voluntary guarding when palpating RLQ. No rigidity. No peritoneal signs.  Musculoskeletal:        General: Normal range of motion.     Cervical back: Normal range of motion.     Comments: Full range of motion of the T-spine and L-spine No tenderness to palpation of the spinous processes of the T-spine or L-spine No crepitus, deformity or step-offs No tenderness to palpation of the paraspinous muscles of the L-spine     Skin:    General: Skin is warm and dry.     Capillary Refill: Capillary refill takes less than 2 seconds.  Neurological:     Mental Status: He is oriented to person, place, and time.     GCS: GCS eye subscore is 4. GCS verbal subscore is 5. GCS motor subscore is 6.     Comments: Fluent speech, no facial droop.  Psychiatric:        Behavior: Behavior normal.     ED Results / Procedures / Treatments   Labs (all labs ordered are listed, but only abnormal results are displayed) Labs Reviewed  COMPREHENSIVE METABOLIC PANEL - Abnormal; Notable for the following components:      Result Value   Total Bilirubin <0.1 (*)    All other components within normal limits  CBC WITH DIFFERENTIAL/PLATELET  LIPASE, BLOOD  URINALYSIS, ROUTINE W REFLEX MICROSCOPIC    EKG None  Radiology CT ABDOMEN PELVIS W CONTRAST  Result Date: 09/01/2020 CLINICAL DATA:  44 year old with right lower quadrant abdominal pain. EXAM: CT ABDOMEN AND PELVIS WITH CONTRAST TECHNIQUE: Multidetector CT imaging of the abdomen and pelvis was performed using the standard protocol following bolus administration of intravenous contrast. CONTRAST:  OMNIPAQUE IOHEXOL 300 MG/ML  SOLN COMPARISON:  None. FINDINGS: Lower chest: Lung bases are clear. Hepatobiliary: No focal liver abnormality is seen. No gallbladder wall  thickening or biliary dilatation. Pancreas: Unremarkable. No pancreatic ductal dilatation or surrounding inflammatory changes. Spleen: Normal in size without focal abnormality. Adrenals/Urinary Tract: Normal appearance of the adrenal glands. Probable tiny cyst in left kidney lower pole. No hydronephrosis. No suspicious renal lesions. Stomach/Bowel: Stomach is within normal limits. Appendix appears normal. No evidence of bowel wall thickening, distention, or inflammatory changes. Vascular/Lymphatic: No significant vascular findings are present. No enlarged abdominal or pelvic lymph nodes. Reproductive: Prostate contains a few  calcifications. No significant enlargement. Other: Negative for ascites.  Negative for free air. Musculoskeletal: No acute abnormality. IMPRESSION: No acute abnormality in the abdomen or pelvis. Electronically Signed   By: Richarda Overlie M.D.   On: 09/01/2020 13:24    Procedures Procedures (including critical care time)  Medications Ordered in ED Medications  iohexol (OMNIPAQUE) 300 MG/ML solution 100 mL (100 mLs Intravenous Contrast Given 09/01/20 1258)    ED Course  I have reviewed the triage vital signs and the nursing notes.  Pertinent labs & imaging results that were available during my care of the patient were reviewed by me and considered in my medical decision making (see chart for details).    MDM Rules/Calculators/A&P                          History provided by patient with additional history obtained from chart review.    Patient presents to the ED with complaints of abdominal pain. Patient nontoxic appearing, in no apparent distress, vitals WNL . On exam patient tender to RLQ with voluntary guarding, no peritoneal signs. Will evaluate with labs and CT. He decline need for analgesics or anti-emetics.  Labs reviewed and grossly unremarkable. No leukocytosis, no anemia, no significant electrolyte derangements. LFTs, renal function, and lipase WNL. Urinalysis without  obvious infection.   I viewed CT scan. Imaging without any acute findings.  On repeat abdominal exam patient remains without peritoneal signs, doubt cholecystitis, pancreatitis, diverticulitis, appendicitis, bowel obstruction/perforation. Patient tolerating PO in the emergency department. Will discharge home with supportive measures. I discussed results, treatment plan, need for PCP follow-up, and return precautions with the patient. Provided opportunity for questions, patient confirmed understanding and is in agreement with plan.    Portions of this note were generated with Scientist, clinical (histocompatibility and immunogenetics). Dictation errors may occur despite best attempts at proofreading.    Final Clinical Impression(s) / ED Diagnoses Final diagnoses:  Abdominal pain, unspecified abdominal location    Rx / DC Orders ED Discharge Orders    None       Sherene Sires, PA-C 09/01/20 1354    Alvira Monday, MD 09/01/20 2123

## 2020-09-01 NOTE — ED Triage Notes (Signed)
Pt c/o right sided low abdominal pain onset about 1 week ago. Pt denies Nausea or vomiting.  Pt started new job at [employer] and thinks it may be the cause for the pain.

## 2020-09-01 NOTE — Discharge Instructions (Addendum)
The blood work you had today was all normal.  Your urine sample did not show any signs of infection.  CT scan of your abdomen and pelvis was also normal.  It is possible your pain is being caused by a pulled muscle or muscle tear.  -Recommend you try taking ibuprofen 600 mg every 6 hours for the next x1 week.  Take it with food so it does not cause an upset stomach. -It is safe to also take Tylenol at the same time. Take as directed on the bottle.  If you continue to have pain you should follow-up with your primary care doctor for further evaluation.

## 2021-11-22 ENCOUNTER — Other Ambulatory Visit: Payer: Self-pay

## 2021-11-22 ENCOUNTER — Encounter: Payer: Self-pay | Admitting: Physician Assistant

## 2021-11-22 ENCOUNTER — Ambulatory Visit (INDEPENDENT_AMBULATORY_CARE_PROVIDER_SITE_OTHER): Payer: BC Managed Care – PPO | Admitting: Physician Assistant

## 2021-11-22 VITALS — BP 109/62 | HR 62 | Ht 66.5 in | Wt 151.0 lb

## 2021-11-22 DIAGNOSIS — F422 Mixed obsessional thoughts and acts: Secondary | ICD-10-CM | POA: Diagnosis not present

## 2021-11-22 DIAGNOSIS — F411 Generalized anxiety disorder: Secondary | ICD-10-CM

## 2021-11-22 MED ORDER — SERTRALINE HCL 100 MG PO TABS: ORAL_TABLET | ORAL | 1 refills | Status: DC

## 2021-11-22 NOTE — Progress Notes (Signed)
[facility] MD/PA/NP Initial Note  11/22/2021 10:05 AM Roger Robertson  MRN:  [id number]  Chief Complaint:  Chief Complaint   Establish Care     HPI: To re-establish care.  He was a patient of mine until about 2019. States he didn't want to be on meds forever so he didn't come back in and wasn't seeing any provider for mental health.   Since that time he has gotten a divorce.  He has dated sometimes since his divorce but not recently.  States he is very stressed, he has a new dog which has been stressful, he has 50-50 custody of his kids who are now 27, 43, and 33 years old.  He really enjoys being with them.  States he worries about everything though if it is not about the kids, it is work, or the dog, or anything else that comes to mind.  He obsesses about everything.  He checks doors, windows, the stove to make sure it is off, things like that over, and over, and over again.  Then if he has to go somewhere he will get a little ways down the road and start worrying that he did not lock the door.  It is affecting his home life as well as at work.  He also has to make sure the towels are folded a certain way and other things in the house have to be perfect or he cannot stand it.  His daughters tell him that he is wound up too tight and that he needs to chill out.  Has been diagnosed with OCD and was up to 400 mg of Zoloft at 1 point a few years ago.  It was effective but again he did not want to be on medications all his life so weaned off of it.  States he thought he could handle things on his own.  His friends and other family members say that he was doing much better when he was on the medication.  He has realized that now and would like to restart Zoloft or something else if I have another recommendation.  He is not having panic attacks, but is anxious every day.  Currently training for a marathon. The exercise is helpful, it is about the only time he gets to himself and it helps clear his mind.  He is  in very good shape physically.  Patient denies loss of interest in usual activities and is able to enjoy things.  Denies decreased energy or motivation.  Appetite has not changed.  No extreme sadness, tearfulness, or feelings of hopelessness.  Reports difficulty focusing sometimes, he feels it is due to fatigue and having so much on his plate though.  He sleeps really well.  He is exhausted each evening so has no trouble sleeping.  Denies suicidal or homicidal thoughts.  Patient denies increased energy with decreased need for sleep, no increased talkativeness, no racing thoughts, no impulsivity or risky behaviors, no increased spending, no increased libido, no grandiosity, no increased irritability or anger, and no hallucinations.    Visit Diagnosis:    ICD-10-CM   1. Mixed obsessional thoughts and acts  F42.2     2. Generalized anxiety disorder  F41.1       Past Psychiatric History:   Past medications for mental health diagnoses include: Zoloft, Xanax (took rarely)   No history of self-harm, suicide attempts, or hospitalizations.    Past Medical History:  Past Medical History:  Diagnosis Date   Anxiety  Depression    Headache    Obsessive-compulsive disorder     Past Surgical History:  Procedure Laterality Date   HERNIA REPAIR      Family Psychiatric History: See below  Family History:  Family History  Problem Relation Age of Onset   OCD Mother    Hypertension Mother    OCD Father    Anxiety disorder Father    Depression Father    Diverticulitis Father    Heart attack Father    Hyperlipidemia Father    Sleep apnea Father    OCD Brother    Anxiety disorder Brother    Anxiety disorder Maternal Grandmother    OCD Maternal Grandmother    Hypertension Maternal Grandmother    Heart attack Paternal Grandfather    Breast cancer Paternal Grandmother    Healthy Daughter    Healthy Daughter    Healthy Daughter     Social History:  Social History    Socioeconomic History   Marital status: Divorced    Spouse name: Not on file   Number of children: 3   Years of education: Not on file   Highest education level: Bachelor's degree (e.g., BA, AB, BS)  Occupational History   Not on file  Tobacco Use   Smoking status: Never   Smokeless tobacco: Never  Vaping Use   Vaping Use: Never used  Substance and Sexual Activity   Alcohol use: Yes    Comment: Rare-maybe 0-2 month.   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  Then he went to work for [employer], now at the current position.      Divorced. Not dating right now but has some since his divorce a few years ago.   3 daughters, 266, 158, 48 yo.      [place] Good childhood. Never abused.   Dad worked in Visual merchandiserfurniture business for a long time, then rep for [employer]. Mom was in retail, in [place].      Pt is oldest. Has younger brother by 18 months.       Caffeine-  4-5 cups of coffee per day   Legal- none   Religion- Christian   Social Determinants of Health   Financial Resource Strain: Low Risk    Difficulty of Paying Living Expenses: Not hard at all  Food Insecurity: No Food Insecurity   Worried About Programme researcher, broadcasting/film/videounning Out of Food in the Last Year: Never true   Baristaan Out of Food in the Last Year: Never true  Transportation Needs: No Transportation Needs   Lack of Transportation (Medical): No   Lack of Transportation (Non-Medical): No  Physical Activity: Sufficiently Active   Days of Exercise per Week: 6 days   Minutes of Exercise per Session: 60 min  Stress: Stress Concern Present   Feeling of Stress : Rather much  Social Connections: Moderately Integrated   Frequency of Communication with Friends and Family: More than three times a week   Frequency of Social Gatherings with Friends and  Family: Once a week   Attends Religious Services: More than 4 times per year   Active Member of [employer]ubs or Organizations: Yes   Attends Engineer, structuralClub or Organization Meetings: More than 4 times per year   Marital Status: Divorced  Allergies: No Known Allergies  Metabolic Disorder Labs: No results found for: HGBA1C, MPG No results found for: PROLACTIN No results found for: CHOL, TRIG, HDL, CHOLHDL, VLDL, LDLCALC No results found for: TSH  Therapeutic Level Labs: No results found for: LITHIUM No results found for: VALPROATE No components found for:  CBMZ  Current Medications: Current Outpatient Medications  Medication Sig Dispense Refill   ibuprofen (ADVIL) 200 MG tablet Take 200 mg by mouth every 6 (six) hours as needed.     sertraline (ZOLOFT) 100 MG tablet 1/2 po qd for 2 weeks, then 1 pill day. 30 tablet 1   aspirin-acetaminophen-caffeine (EXCEDRIN MIGRAINE) 250-250-65 MG tablet Take 1 tablet by mouth every 6 (six) hours as needed for headache. (Patient not taking: Reported on 11/22/2021)     ondansetron (ZOFRAN) 4 MG tablet Take 1 tablet (4 mg total) by mouth every 6 (six) hours as needed for nausea or vomiting. (Patient not taking: Reported on 11/22/2021) 12 tablet 0   SUMAtriptan (IMITREX) 50 MG tablet Take 50 mg by mouth every 2 (two) hours as needed for migraine or headache.  (Patient not taking: Reported on 11/22/2021)     No current facility-administered medications for this visit.    Medication Side Effects: none  Orders placed this visit:  No orders of the defined types were placed in this encounter.   Psychiatric Specialty Exam:  Review of Systems  Constitutional: Negative.   HENT: Negative.    Eyes: Negative.   Respiratory: Negative.    Cardiovascular: Negative.   Endocrine: Negative.   Genitourinary: Negative.   Musculoskeletal: Negative.   Skin: Negative.   Allergic/Immunologic: Negative.   Neurological: Negative.   Hematological: Negative.    Psychiatric/Behavioral:         See HPI   Blood pressure 109/62, pulse 62, height 5' 6.5" (1.689 m), weight 151 lb (68.5 kg).Body mass index is 24.01 kg/m.  General Appearance: Casual and Well Groomed  Eye Contact:  Good  Speech:  Clear and Coherent and Normal Rate  Volume:  Normal  Mood:  Anxious  Affect:  Anxious  Thought Process:  Goal Directed and Descriptions of Associations: Circumstantial  Orientation:  Full (Time, Place, and Person)  Thought Content: Logical   Suicidal Thoughts:  No  Homicidal Thoughts:  No  Memory:  WNL  Judgement:  Good  Insight:  Good  Psychomotor Activity:  Normal  Concentration:  Concentration: Good  Recall:  Good  Fund of Knowledge: Good  Language: Good  Assets:  Desire for Improvement  ADL's:  Intact  Cognition: WNL  Prognosis:  Good   Screenings:  GAD-7    Flowsheet Row Office Visit from 11/22/2021 in [facility]  Total GAD-7 Score 17      PHQ2-9    Flowsheet Row Office Visit from 11/22/2021 in [facility]  PHQ-2 Total Score 2  PHQ-9 Total Score 3       Receiving Psychotherapy: No   Treatment Plan/Recommendations:  PDMP reviewed.  No results available. I provided 60 minutes of face to face time during this encounter, including time spent before and after the visit in records review, medical decision making, counseling pertinent to today's visit, and charting.  We discussed different options and I recommend restarting the Zoloft.  We know he responded well to it, it did not cause any side effects that he can remember, so it is a good choice.  We did briefly discuss Luvox, but personally I do not think it works any  better than Zoloft, and especially since we know Zoloft worked for him.  Pros and cons were discussed and he accepts. Also discussed counseling, neither of [place] feel that is necessary at this point.   Restart Zoloft 100 mg, one half p.o. daily for 2 weeks and then increase to 1 p.o.  daily. Return in 6 weeks.   Melony Overly, PA-C

## 2022-01-03 ENCOUNTER — Encounter: Payer: Self-pay | Admitting: Physician Assistant

## 2022-01-03 ENCOUNTER — Ambulatory Visit (INDEPENDENT_AMBULATORY_CARE_PROVIDER_SITE_OTHER): Payer: BC Managed Care – PPO | Admitting: Physician Assistant

## 2022-01-03 ENCOUNTER — Other Ambulatory Visit: Payer: Self-pay

## 2022-01-03 DIAGNOSIS — F411 Generalized anxiety disorder: Secondary | ICD-10-CM | POA: Diagnosis not present

## 2022-01-03 DIAGNOSIS — F422 Mixed obsessional thoughts and acts: Secondary | ICD-10-CM

## 2022-01-03 MED ORDER — SERTRALINE HCL 100 MG PO TABS: ORAL_TABLET | ORAL | 1 refills | Status: DC

## 2022-01-03 NOTE — Progress Notes (Signed)
[facility] ? ?Patient ID: Roger Robertson,  ?MRN: [id number] ? ?PCP: Loyal Jacobson, MD ? ?Date of Evaluation: 01/03/2022 ?Time spent:20 minutes ? ?Chief Complaint:  ?Chief Complaint   ?Anxiety; Follow-up ?  ? ? ?HISTORY/CURRENT STATUS: ?HPI for 6-week med check. ? ?6 weeks ago we restarted Zoloft for severe OCD.  He really cannot tell any difference yet.  The last time he was seen in 2019 he had gotten up to 350 to 400 mg which did help.  He still worries about everything, mostly about his 27, 57, and 80 year old kids.  Also worries about his dog, work, things around the house, or what ever comes to his mind.  He obsesses about cleanliness, whether the doors and windows are locked, making sure the stove is turned off, germs, has to have the towels folded a certain way and things in the house have to be perfect or else he is unable to relax at all.  He is still wound up very tight, told by his friends and his daughters.  He really does want to feel better and not have all of these anxieties that are controlling him.  It does affect his life both at home and at work. ? ?Patient denies loss of interest in usual activities and is able to enjoy things.  Denies decreased energy or motivation.  Appetite has not changed.  No extreme sadness, tearfulness, or feelings of hopelessness.  Denies any changes in concentration, making decisions or remembering things.  He sleeps fairly well.  Sometimes he cannot drift off to sleep too easily because he cannot get his mind to slow down.  Denies suicidal or homicidal thoughts. ? ?Patient denies increased energy with decreased need for sleep, no increased talkativeness, no racing thoughts, no impulsivity or risky behaviors, no increased spending, no increased libido, no grandiosity, no increased irritability or anger, and no hallucinations. ? ?Denies dizziness, syncope, seizures, numbness, tingling, tremor, tics, unsteady gait, slurred speech, confusion. Denies muscle or joint  pain, stiffness, or dystonia. ? ?Individual Medical History/ Review of Systems: Changes? :No  ? ?Allergies: Patient has no known allergies. ? ?Current Medications:  ?Current Outpatient Medications:  ?  ibuprofen (ADVIL) 200 MG tablet, Take 200 mg by mouth every 6 (six) hours as needed., Disp: , Rfl:  ?  aspirin-acetaminophen-caffeine (EXCEDRIN MIGRAINE) 250-250-65 MG tablet, Take 1 tablet by mouth every 6 (six) hours as needed for headache. (Patient not taking: Reported on 11/22/2021), Disp: , Rfl:  ?  ondansetron (ZOFRAN) 4 MG tablet, Take 1 tablet (4 mg total) by mouth every 6 (six) hours as needed for nausea or vomiting. (Patient not taking: Reported on 11/22/2021), Disp: 12 tablet, Rfl: 0 ?  sertraline (ZOLOFT) 100 MG tablet, 1 1/2 po q a.m. for 2 weeks, then increase to 2 pills., Disp: 60 tablet, Rfl: 1 ?  SUMAtriptan (IMITREX) 50 MG tablet, Take 50 mg by mouth every 2 (two) hours as needed for migraine or headache.  (Patient not taking: Reported on 11/22/2021), Disp: , Rfl:  ?Medication Side Effects: none ? ?Family Medical/ Social History: Changes? No ? ?MENTAL HEALTH EXAM: ? ?There were no vitals taken for this visit.There is no height or weight on file to calculate BMI.  ?General Appearance: Casual, Neat, and Well Groomed  ?Eye Contact:  Good  ?Speech:  Clear and Coherent and Normal Rate  ?Volume:  Normal  ?Mood:  Anxious  ?Affect:  Anxious  ?Thought Process:  Goal Directed and Descriptions of Associations: Circumstantial  ?Orientation:  Full (  Time, Place, and Person)  ?Thought Content: Obsessions and Rumination   ?Suicidal Thoughts:  No  ?Homicidal Thoughts:  No  ?Memory:  WNL  ?Judgement:  Good  ?Insight:  Good  ?Psychomotor Activity:  Normal  ?Concentration:  Concentration: Good  ?Recall:  Good  ?Fund of Knowledge: Good  ?Language: Good  ?Assets:  Desire for Improvement  ?ADL's:  Intact  ?Cognition: WNL  ?Prognosis:  Good  ? ? ?DIAGNOSES:  ?  ICD-10-CM   ?1. Mixed obsessional thoughts and acts  F42.2   ?   ?2. Generalized anxiety disorder  F41.1   ?  ? ? ?Receiving Psychotherapy: No  ? ? ?RECOMMENDATIONS:  ?PDMP reviewed.  No results available. ?I provided 20 minutes of face to face time during this encounter, including time spent before and after the visit in records review, medical decision making, counseling pertinent to today's visit, and charting.  ?We discussed that severe OCD sometimes requires even up to 300 to 400 mg of Zoloft.  We can increase rapidly due to the effects on the brain, plus he may not need that high of a dose.  However I will increase more quickly than I normally do, if he tolerates the increase, especially with sweating.  He understands that seems to be dose dependent and he is not having issues with that now. In fact he reports no side effects. ? ?Increase Zoloft 100 mg, 1.5 pills daily for 2 weeks and then increase to 2 pills.  If he is having increased sweating or intolerable side effects he can stick with the 1.5 pills for a month, and if not seeing any benefit increase to 200 mg at that time. ?Consider therapy. ?Return in 2 months. ? ? ?Melony Overly, PA-C  ?         ?

## 2022-03-07 ENCOUNTER — Ambulatory Visit: Payer: BC Managed Care – PPO | Admitting: Physician Assistant

## 2022-03-13 ENCOUNTER — Encounter: Payer: Self-pay | Admitting: Physician Assistant

## 2022-03-13 ENCOUNTER — Ambulatory Visit (INDEPENDENT_AMBULATORY_CARE_PROVIDER_SITE_OTHER): Payer: BC Managed Care – PPO | Admitting: Physician Assistant

## 2022-03-13 DIAGNOSIS — F411 Generalized anxiety disorder: Secondary | ICD-10-CM | POA: Diagnosis not present

## 2022-03-13 DIAGNOSIS — F422 Mixed obsessional thoughts and acts: Secondary | ICD-10-CM | POA: Diagnosis not present

## 2022-03-13 DIAGNOSIS — F32A Depression, unspecified: Secondary | ICD-10-CM | POA: Diagnosis not present

## 2022-03-13 MED ORDER — SERTRALINE HCL 100 MG PO TABS: 200.0000 mg | ORAL_TABLET | Freq: Every day | ORAL | 0 refills | Status: DC

## 2022-03-13 MED ORDER — BUPROPION HCL ER (XL) 150 MG PO TB24: 150.0000 mg | ORAL_TABLET | ORAL | 0 refills | Status: DC

## 2022-03-13 NOTE — Progress Notes (Signed)
[facility] ? ?Patient ID: Roger Robertson,  ?MRN: [id number] ? ?PCP: Loyal Jacobson, MD ? ?Date of Evaluation: 03/13/2022 ?Time spent:20 minutes ? ?Chief Complaint:  ?Chief Complaint   ?Anxiety; Follow-up ?  ? ? ?HISTORY/CURRENT STATUS: ?HPI for routine med check. ? ?Zoloft was increased about 2 months ago now.  He is much better with the OCD.  States it no longer controls him, he is more able to control it.  Not worrying about things like he had been, work, his children, their dog, checking doors or locks, windows, checking the stove to make sure it is off, things like that.  So he feels that the Zoloft is helping.  When he was on it several years ago we increased up to 350 mg.  He does not feel that he needs to do that now.  Not having generalized anxiety or panic attacks.  Denies sexual side effects from the Zoloft.  He sleeps about 5 to 6 hours a day, has been doing that for many years.  He gets up like clockwork every day at 5 AM 7 days a week. ? ?He asks about Wellbutrin.  His girlfriend is a Engineer, civil (consulting) and she has mentioned that to him.  Has decreased energy and motivation at times but it does not affect him going to work.  He is not missing.  He is able to enjoy things.  Appetite is normal and weight is stable.  He still enjoys running.  ADLs and personal hygiene are normal.  No suicidal or homicidal thoughts. ? ?Patient denies increased energy with decreased need for sleep, no increased talkativeness, no racing thoughts, no impulsivity or risky behaviors, no increased spending, no increased libido, no grandiosity, no increased irritability or anger, and no hallucinations. ? ?Denies dizziness, syncope, seizures, numbness, tingling, tremor, tics, unsteady gait, slurred speech, confusion. Denies muscle or joint pain, stiffness, or dystonia. ? ?Individual Medical History/ Review of Systems: Changes? :No  ? ?Allergies: Patient has no known allergies. ? ?Current Medications:  ?Current Outpatient Medications:   ?  buPROPion (WELLBUTRIN XL) 150 MG 24 hr tablet, Take 1 tablet (150 mg total) by mouth every morning., Disp: 90 tablet, Rfl: 0 ?  ibuprofen (ADVIL) 200 MG tablet, Take 200 mg by mouth every 6 (six) hours as needed., Disp: , Rfl:  ?  aspirin-acetaminophen-caffeine (EXCEDRIN MIGRAINE) 250-250-65 MG tablet, Take 1 tablet by mouth every 6 (six) hours as needed for headache. (Patient not taking: Reported on 11/22/2021), Disp: , Rfl:  ?  ondansetron (ZOFRAN) 4 MG tablet, Take 1 tablet (4 mg total) by mouth every 6 (six) hours as needed for nausea or vomiting. (Patient not taking: Reported on 11/22/2021), Disp: 12 tablet, Rfl: 0 ?  sertraline (ZOLOFT) 100 MG tablet, Take 2 tablets (200 mg total) by mouth daily., Disp: 180 tablet, Rfl: 0 ?  SUMAtriptan (IMITREX) 50 MG tablet, Take 50 mg by mouth every 2 (two) hours as needed for migraine or headache.  (Patient not taking: Reported on 11/22/2021), Disp: , Rfl:  ?Medication Side Effects: none ? ?Family Medical/ Social History: Changes? No ? ?MENTAL HEALTH EXAM: ? ?There were no vitals taken for this visit.There is no height or weight on file to calculate BMI.  ?General Appearance: Casual, Neat, and Well Groomed  ?Eye Contact:  Good  ?Speech:  Clear and Coherent and Normal Rate  ?Volume:  Normal  ?Mood:  Euthymic  ?Affect:  Congruent  ?Thought Process:  Goal Directed and Descriptions of Associations: Circumstantial  ?Orientation:  Full (  Time, Place, and Person)  ?Thought Content: Logical   ?Suicidal Thoughts:  No  ?Homicidal Thoughts:  No  ?Memory:  WNL  ?Judgement:  Good  ?Insight:  Good  ?Psychomotor Activity:  Normal  ?Concentration:  Concentration: Good  ?Recall:  Good  ?Fund of Knowledge: Good  ?Language: Good  ?Assets:  Desire for Improvement  ?ADL's:  Intact  ?Cognition: WNL  ?Prognosis:  Good  ? ? ?DIAGNOSES:  ?  ICD-10-CM   ?1. Mixed obsessional thoughts and acts  F42.2   ?  ?2. Generalized anxiety disorder  F41.1   ?  ?3. Mild depression  F32.A   ?  ? ? ? ?Receiving  Psychotherapy: No  ? ? ?RECOMMENDATIONS:  ?PDMP reviewed.  No results available. ?I provided 20 minutes of face to face time during this encounter, including time spent before and after the visit in records review, medical decision making, counseling pertinent to today's visit, and charting.  ?I am glad to see him doing better.  We discussed the Wellbutrin and its benefits, risks and side effects.  Due to the melancholy and lack of energy, it is a good idea to start the Wellbutrin.  He would like to try it. ? ?Start Wellbutrin XL 150 mg, 1 p.o. every morning. ?Continue Zoloft 100 mg, 2 p.o. daily. ?Consider therapy. ?Return in 6 weeks. ? ?Melony Overly, PA-C  ?         ?

## 2022-04-03 ENCOUNTER — Other Ambulatory Visit (HOSPITAL_COMMUNITY): Payer: Self-pay | Admitting: Family Medicine

## 2022-04-03 ENCOUNTER — Ambulatory Visit (HOSPITAL_COMMUNITY)
Admission: RE | Admit: 2022-04-03 | Discharge: 2022-04-03 | Disposition: A | Discharge status: Home / Self Care | Payer: BC Managed Care – PPO | Source: Ambulatory Visit | Attending: Family Medicine | Admitting: Family Medicine

## 2022-04-03 DIAGNOSIS — M79605 Pain in left leg: Secondary | ICD-10-CM | POA: Insufficient documentation

## 2022-04-21 ENCOUNTER — Encounter: Payer: Self-pay | Admitting: Physician Assistant

## 2022-04-21 ENCOUNTER — Ambulatory Visit (INDEPENDENT_AMBULATORY_CARE_PROVIDER_SITE_OTHER): Payer: BC Managed Care – PPO | Admitting: Physician Assistant

## 2022-04-21 DIAGNOSIS — F411 Generalized anxiety disorder: Secondary | ICD-10-CM | POA: Diagnosis not present

## 2022-04-21 DIAGNOSIS — F422 Mixed obsessional thoughts and acts: Secondary | ICD-10-CM

## 2022-04-21 DIAGNOSIS — F32A Depression, unspecified: Secondary | ICD-10-CM

## 2022-04-21 MED ORDER — BUPROPION HCL ER (XL) 150 MG PO TB24: 300.0000 mg | ORAL_TABLET | ORAL | 0 refills | Status: DC

## 2022-04-21 NOTE — Progress Notes (Signed)
[facility]  Patient ID: Roger Robertson,  MRN: [id number]  PCP: Loyal Jacobson, MD  Date of Evaluation: 04/21/2022 Time spent:20 minutes  Chief Complaint:  Chief Complaint   Anxiety; Follow-up     HISTORY/CURRENT STATUS: HPI for routine med check.  We started Wellbutrin 5 to 6 weeks ago.  It has helped with energy and motivation quite a bit.  He is not as tired all the time.  But wonders if we could increase the dose so he could feel a little bit better.  States his mood is good.  He is able to enjoy things.  Work is going well.  He is not missing any due to his mood.  Appetite is normal and weight is stable.  ADLs and personal hygiene are normal.  No suicidal or homicidal thoughts.  OCD symptoms are still very well controlled.  He does have some obsessive thoughts at times and checks locks and such occasionally but nothing like he did.  Not having generalized anxiety to speak of.  Patient denies increased energy with decreased need for sleep, no increased talkativeness, no racing thoughts, no impulsivity or risky behaviors, no increased spending, no increased libido, no grandiosity, no increased irritability or anger, and no hallucinations.  Denies dizziness, syncope, seizures, numbness, tingling, tremor, tics, unsteady gait, slurred speech, confusion. Denies muscle or joint pain, stiffness, or dystonia.  Individual Medical History/ Review of Systems: Changes? :Yes   broke his left pinky toe. Left calf muscle was torn.   Allergies: Patient has no known allergies.  Current Medications:  Current Outpatient Medications:    ibuprofen (ADVIL) 200 MG tablet, Take 200 mg by mouth every 6 (six) hours as needed., Disp: , Rfl:    sertraline (ZOLOFT) 100 MG tablet, Take 2 tablets (200 mg total) by mouth daily., Disp: 180 tablet, Rfl: 0   aspirin-acetaminophen-caffeine (EXCEDRIN MIGRAINE) 250-250-65 MG tablet, Take 1 tablet by mouth every 6 (six) hours as needed for headache.  (Patient not taking: Reported on 11/22/2021), Disp: , Rfl:    buPROPion (WELLBUTRIN XL) 150 MG 24 hr tablet, Take 2 tablets (300 mg total) by mouth every morning., Disp: 90 tablet, Rfl: 0   ondansetron (ZOFRAN) 4 MG tablet, Take 1 tablet (4 mg total) by mouth every 6 (six) hours as needed for nausea or vomiting. (Patient not taking: Reported on 11/22/2021), Disp: 12 tablet, Rfl: 0   SUMAtriptan (IMITREX) 50 MG tablet, Take 50 mg by mouth every 2 (two) hours as needed for migraine or headache.  (Patient not taking: Reported on 11/22/2021), Disp: , Rfl:  Medication Side Effects: none  Family Medical/ Social History: Changes? No  MENTAL HEALTH EXAM:  There were no vitals taken for this visit.There is no height or weight on file to calculate BMI.  General Appearance: Casual, Neat, and Well Groomed  Eye Contact:  Good  Speech:  Clear and Coherent and Normal Rate  Volume:  Normal  Mood:  Euthymic  Affect:  Congruent  Thought Process:  Goal Directed and Descriptions of Associations: Circumstantial  Orientation:  Full (Time, Place, and Person)  Thought Content: Logical   Suicidal Thoughts:  No  Homicidal Thoughts:  No  Memory:  WNL  Judgement:  Good  Insight:  Good  Psychomotor Activity:  Normal  Concentration:  Concentration: Good  Recall:  Good  Fund of Knowledge: Good  Language: Good  Assets:  Desire for Improvement Financial Resources/Insurance Housing Transportation Vocational/Educational  ADL's:  Intact  Cognition: WNL  Prognosis:  Good  DIAGNOSES:    ICD-10-CM   1. Mixed obsessional thoughts and acts  F42.2     2. Mild depression  F32.A     3. Generalized anxiety disorder  F41.1       Receiving Psychotherapy: No    RECOMMENDATIONS:  PDMP reviewed.  No results available. I provided 20 minutes of face to face time during this encounter, including time spent before and after the visit in records review, medical decision making, counseling pertinent to today's visit,  and charting.  I am glad to see him doing better but agree that increasing the Wellbutrin can benefit him even more.  He does not want to go to the maximum dose, that is not appropriate at this time anyway.  If the XL 300 mg causes anxiety that does not go away within a week or 2 then he can let me know and I will change to Wellbutrin SR 100 mg twice daily.  He will call in approximately 3 weeks to let me know how he is doing with this dose.  Then I will send in the 300 mg if he is doing well.  He verbalizes understanding.  Increase Wellbutrin XL 150 mg, to 2 p.o. every morning. Continue Zoloft 100 mg, 2 p.o. daily. Consider therapy. Return in 6 weeks.  Melony Overly, PA-C

## 2022-05-12 ENCOUNTER — Telehealth: Payer: Self-pay | Admitting: Physician Assistant

## 2022-05-12 ENCOUNTER — Other Ambulatory Visit: Payer: Self-pay

## 2022-05-12 MED ORDER — BUPROPION HCL ER (XL) 150 MG PO TB24: 300.0000 mg | ORAL_TABLET | ORAL | 0 refills | Status: DC

## 2022-05-12 NOTE — Telephone Encounter (Signed)
Rx sent 

## 2022-05-12 NOTE — Telephone Encounter (Signed)
Roger Robertson called this morning at 9:20 to report that the increase in Wellbutrin is working very well.  He will need a refill.  Appt 06/04/22. Send to [facility] on [address].  He also said you were trying to sync his Zoloft prescriptions so they come due at the same time.  He has 33 days left of his Zoloft.

## 2022-05-13 ENCOUNTER — Telehealth: Payer: Self-pay | Admitting: Physician Assistant

## 2022-05-13 NOTE — Telephone Encounter (Signed)
Pt called at 4pm and said that the pharmacy will not fill his wellbutrin until 7/18. He increased his dose so teresa sent in a new script for the new dose. Please call the pharmacy and let them know he said he will be out in two days

## 2022-05-13 NOTE — Telephone Encounter (Signed)
Per pharmacy insurance said too early to fill.  Told them dose had increased and new Rx was sent yesterday. They asked for a date that dose was increased and I gave 6/19, based on the note for that visit. They will contact insurance company. Patient notified.  He gets texts from pharmacy when Rx is ready and he was told to keep a lookout and let us know if there was a problem.

## 2022-05-14 NOTE — Telephone Encounter (Signed)
Brain called again this morning at 9:13 to report that he had talked with the pharmacy last night.  The pharmacist told him that she had talked with the insurance but that told her that they have to now here directly from the dr. About the dose change.  Perhaps it needs a PA for 2/day?  But the insurance would still not cover the medication and the pharmacy can't fill it. He is almost out, what can we do to help?

## 2022-05-14 NOTE — Telephone Encounter (Signed)
Contacted [employer] and they are going to do a one time override since he is just a few days away from needing a refill anyway. Notified patient.

## 2022-05-15 ENCOUNTER — Telehealth: Payer: Self-pay | Admitting: Physician Assistant

## 2022-05-15 ENCOUNTER — Other Ambulatory Visit: Payer: Self-pay | Admitting: Physician Assistant

## 2022-05-15 MED ORDER — TADALAFIL 10 MG PO TABS: 10.0000 mg | ORAL_TABLET | Freq: Every day | ORAL | 0 refills | Status: DC | PRN

## 2022-05-15 NOTE — Telephone Encounter (Signed)
Patient also aware to use [facility] if they say he needs a PA. A one-time 30-day supply at [facility] was 5.99.

## 2022-05-15 NOTE — Telephone Encounter (Signed)
let him know I sent in Cialis.  It is very important for him to understand if he takes this with nitroglycerin that it can cause serious problems with decreasing blood pressure so if for any reason he has to take nitroglycerin, he needs to tell his provider that he is taking Cialis.  The effects of Cialis may last up to 36 hours.  If he has dizziness and headache, feels faint, anything like that he should not take anymore, and let me know immediately.  He may have to have a prior authorization.  I would recommend he pay out of pocket I do not think it is expensive although I have not looked it up.

## 2022-05-15 NOTE — Telephone Encounter (Signed)
Called patient. He said he just got married last night and things did not go as planned afterwards.  He said he was unable to maintain an erection and was not able to ejaculate. He said you warned him that there might be sexual SE to his medication. He is also asking if you can prescribe something that will fix this quickly - possibly Viagra or similar.   Pharmacy is [facility] on [address].

## 2022-05-15 NOTE — Telephone Encounter (Signed)
Pt called and said he is having a "personal" side effect from the medicine Rosey Bath has him on.  It's Wellbutrin and Zoloft.  Pls call him back to discuss.  Next appt 8/2

## 2022-05-15 NOTE — Telephone Encounter (Signed)
Patient notified of Rx and recommendations. He does not take NTG, but he was cautioned regardless. He said his wife is a Engineer, civil (consulting).

## 2022-05-16 ENCOUNTER — Telehealth: Payer: Self-pay

## 2022-05-16 NOTE — Telephone Encounter (Signed)
Patient notified

## 2022-05-16 NOTE — Telephone Encounter (Signed)
Yes, it's most likely the Zoloft. The Wellbutrin helps counteract the sexual SE. There isn't anything else we can do right now and will discuss further at next visit. Let him know the more anxious he gets about this, the worse it will be. Each sexual encounter can be different, just b/c he's had a few experiences this week that haven't been ideal, doesn't mean it will continue to be like that.

## 2022-06-04 ENCOUNTER — Ambulatory Visit (INDEPENDENT_AMBULATORY_CARE_PROVIDER_SITE_OTHER): Payer: BC Managed Care – PPO | Admitting: Physician Assistant

## 2022-06-04 ENCOUNTER — Encounter: Payer: Self-pay | Admitting: Physician Assistant

## 2022-06-04 DIAGNOSIS — F422 Mixed obsessional thoughts and acts: Secondary | ICD-10-CM

## 2022-06-04 DIAGNOSIS — F32A Depression, unspecified: Secondary | ICD-10-CM | POA: Diagnosis not present

## 2022-06-04 DIAGNOSIS — N522 Drug-induced erectile dysfunction: Secondary | ICD-10-CM | POA: Diagnosis not present

## 2022-06-04 DIAGNOSIS — G4482 Headache associated with sexual activity: Secondary | ICD-10-CM

## 2022-06-04 DIAGNOSIS — F411 Generalized anxiety disorder: Secondary | ICD-10-CM | POA: Diagnosis not present

## 2022-06-04 MED ORDER — SERTRALINE HCL 100 MG PO TABS: 100.0000 mg | ORAL_TABLET | Freq: Every day | ORAL | 1 refills | Status: AC

## 2022-06-04 MED ORDER — TADALAFIL 10 MG PO TABS: 10.0000 mg | ORAL_TABLET | Freq: Every day | ORAL | 1 refills | Status: AC | PRN

## 2022-06-04 MED ORDER — BUPROPION HCL ER (XL) 300 MG PO TB24: 300.0000 mg | ORAL_TABLET | Freq: Every day | ORAL | 1 refills | Status: AC

## 2022-06-04 NOTE — Progress Notes (Signed)
[facility]  Patient ID: Roger Robertson,  MRN: [id number]  PCP: Loyal Jacobson, MD  Date of Evaluation: 06/04/2022 Time spent:20 minutes  Chief Complaint:  Chief Complaint   Anxiety; Follow-up    HISTORY/CURRENT STATUS: HPI for routine med check.  Got married since LOV, he called c/o ED. We added Cialis.  That has helped to the ED but he still had trouble with orgasm.  He decreased the Zoloft down to 100 mg on his own.  That has helped with the issue with orgasm.  Still has some generalized anxiety but overall is much better than before going back on the Zoloft.  Not having obsessions and compulsions like he did either.  Even after decreasing the Zoloft dose.  If it is okay he would like to stay on that dose.  The Wellbutrin increase as helped with energy and motivation.  Patient is able to enjoy things.  Work is going well.   No extreme sadness, tearfulness, or feelings of hopelessness.  Sleeps well most of the time.  ADLs and personal hygiene are normal.   Denies any changes in concentration, making decisions, or remembering things.  Appetite has not changed.  Weight is stable.  Denies suicidal or homicidal thoughts.  Patient denies increased energy with decreased need for sleep, increased talkativeness, racing thoughts, impulsivity or risky behaviors, increased spending, increased libido, grandiosity, increased irritability or anger, paranoia, and no hallucinations.  Has a history of migraines.  He had a severe headache yesterday that began during sex.  He had no neurologic deficits during that time.  He had throbbing on the left side of his head, took some ibuprofen and it did help.  Had taken the Cialis the night before and had sex that night, no headache then.  Denies dizziness, syncope, seizures, numbness, tingling, tremor, tics, unsteady gait, slurred speech, confusion. Denies muscle or joint pain, stiffness, or dystonia.  Individual Medical History/ Review of Systems:  Changes? :Yes   see HPI  Allergies: Patient has no known allergies.  Current Medications:  Current Outpatient Medications:    buPROPion (WELLBUTRIN XL) 300 MG 24 hr tablet, Take 1 tablet (300 mg total) by mouth daily., Disp: 90 tablet, Rfl: 1   ibuprofen (ADVIL) 200 MG tablet, Take 200 mg by mouth every 6 (six) hours as needed., Disp: , Rfl:    aspirin-acetaminophen-caffeine (EXCEDRIN MIGRAINE) 250-250-65 MG tablet, Take 1 tablet by mouth every 6 (six) hours as needed for headache. (Patient not taking: Reported on 11/22/2021), Disp: , Rfl:    ondansetron (ZOFRAN) 4 MG tablet, Take 1 tablet (4 mg total) by mouth every 6 (six) hours as needed for nausea or vomiting. (Patient not taking: Reported on 11/22/2021), Disp: 12 tablet, Rfl: 0   sertraline (ZOLOFT) 100 MG tablet, Take 1 tablet (100 mg total) by mouth daily., Disp: 90 tablet, Rfl: 1   SUMAtriptan (IMITREX) 50 MG tablet, Take 50 mg by mouth every 2 (two) hours as needed for migraine or headache.  (Patient not taking: Reported on 11/22/2021), Disp: , Rfl:    tadalafil (CIALIS) 10 MG tablet, Take 1 tablet (10 mg total) by mouth daily as needed for erectile dysfunction., Disp: 90 tablet, Rfl: 1 Medication Side Effects: sexual dysfunction  Family Medical/ Social History: Changes? No  MENTAL HEALTH EXAM:  There were no vitals taken for this visit.There is no height or weight on file to calculate BMI.  General Appearance: Casual, Neat, and Well Groomed  Eye Contact:  Good  Speech:  Clear and  Coherent and Normal Rate  Volume:  Normal  Mood:  Euthymic  Affect:  Congruent  Thought Process:  Goal Directed and Descriptions of Associations: Circumstantial  Orientation:  Full (Time, Place, and Person)  Thought Content: Logical   Suicidal Thoughts:  No  Homicidal Thoughts:  No  Memory:  WNL  Judgement:  Good  Insight:  Good  Psychomotor Activity:  Normal  Concentration:  Concentration: Good and Attention Span: Good  Recall:  Good  Fund of  Knowledge: Good  Language: Good  Assets:  Desire for Improvement Financial Resources/Insurance Housing Transportation Vocational/Educational  ADL's:  Intact  Cognition: WNL  Prognosis:  Good    DIAGNOSES:    ICD-10-CM   1. Mixed obsessional thoughts and acts  F42.2     2. Mild depression  F32.A     3. Generalized anxiety disorder  F41.1     4. 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 Pre-orgasmic headache  G44.82      Receiving Psychotherapy: No   RECOMMENDATIONS:  PDMP reviewed.  No results available. I provided 20 minutes of face to face time during this encounter, including time spent before and after the visit in records review, medical decision making, counseling pertinent to today's visit, and charting.   Congratulations on his marriage!  We discussed the headache he experienced yesterday during sex.  I do not think it is related to the Cialis at all.  I do recommend he see his PCP about that problem, especially if it ever recurs but if he prefers, go ahead and see his PCP about it now.  If he has any neurologic deficits then of course go to the ER immediately.  Since he seems to be doing well as far as OCD since decreasing the Zoloft it is fine to leave it at this dose.  If the ED or anorgasmia recurs then we may need to try a different SSRI.  Briefly discussed Trintellix which has less sexual side effects but does not seem to work as well for OCD.  I reiterated benefits, risks and side effects of the Cialis and he accepts.  Continue Wellbutrin XL 300 mg, 1 p.o. daily. Continue Zoloft 100 mg, 1 p.o. daily. Continue Cialis 10 mg, 1 p.o. daily as needed ED. Consider therapy. Return in 4 months.   Melony Overly, PA-C

## 2022-10-07 ENCOUNTER — Ambulatory Visit: Payer: BC Managed Care – PPO | Admitting: Physician Assistant

## 2023-07-02 ENCOUNTER — Other Ambulatory Visit (HOSPITAL_COMMUNITY): Payer: Self-pay

## 2023-07-03 ENCOUNTER — Other Ambulatory Visit (HOSPITAL_COMMUNITY): Payer: Self-pay

## 2023-07-03 MED ORDER — TADALAFIL 20 MG PO TABS
20.0000 mg | ORAL_TABLET | ORAL | 5 refills | Status: AC | PRN
  Filled 2023-07-03 – 2023-08-24 (×2): qty 30, 60d supply, fill #0
  Filled 2023-08-24: qty 15, 30d supply, fill #0

## 2023-07-08 ENCOUNTER — Other Ambulatory Visit (HOSPITAL_COMMUNITY): Payer: Self-pay

## 2023-07-09 ENCOUNTER — Other Ambulatory Visit (HOSPITAL_COMMUNITY): Payer: Self-pay

## 2023-07-13 ENCOUNTER — Other Ambulatory Visit (HOSPITAL_COMMUNITY): Payer: Self-pay

## 2023-07-20 ENCOUNTER — Other Ambulatory Visit (HOSPITAL_COMMUNITY): Payer: Self-pay

## 2023-08-21 ENCOUNTER — Other Ambulatory Visit (HOSPITAL_COMMUNITY): Payer: Self-pay

## 2023-08-21 MED ORDER — METHYLPREDNISOLONE 4 MG PO TBPK
ORAL_TABLET | ORAL | 0 refills | Status: DC
Start: 2023-08-21 — End: 2023-11-12
  Filled 2023-08-21: qty 21, 6d supply, fill #0

## 2023-08-22 ENCOUNTER — Other Ambulatory Visit (HOSPITAL_COMMUNITY): Payer: Self-pay

## 2023-08-24 ENCOUNTER — Other Ambulatory Visit (HOSPITAL_COMMUNITY): Payer: Self-pay

## 2023-09-11 ENCOUNTER — Other Ambulatory Visit (HOSPITAL_COMMUNITY): Payer: Self-pay

## 2023-09-11 MED ORDER — VALACYCLOVIR HCL 500 MG PO TABS
500.0000 mg | ORAL_TABLET | Freq: Every day | ORAL | 3 refills | Status: AC
  Filled 2023-09-11: qty 90, 90d supply, fill #0
  Filled 2024-01-26: qty 90, 90d supply, fill #1
  Filled 2024-04-22: qty 90, 90d supply, fill #2

## 2023-09-11 MED ORDER — SERTRALINE HCL 100 MG PO TABS
100.0000 mg | ORAL_TABLET | Freq: Every morning | ORAL | 1 refills | Status: DC
Start: 2023-09-11 — End: 2024-03-10
  Filled 2023-09-11: qty 90, 90d supply, fill #0
  Filled 2023-12-07: qty 90, 90d supply, fill #1

## 2023-09-11 MED ORDER — TADALAFIL 10 MG PO TABS
10.0000 mg | ORAL_TABLET | ORAL | 5 refills | Status: AC | PRN
  Filled 2023-09-11: qty 30, 60d supply, fill #0
  Filled 2023-12-07: qty 30, 60d supply, fill #1
  Filled 2024-01-26: qty 30, 60d supply, fill #2
  Filled 2024-04-22: qty 30, 60d supply, fill #3

## 2023-09-11 MED ORDER — BUPROPION HCL ER (XL) 300 MG PO TB24
300.0000 mg | ORAL_TABLET | Freq: Every morning | ORAL | 1 refills | Status: AC
  Filled 2023-09-11: qty 90, 90d supply, fill #0
  Filled 2023-12-07: qty 90, 90d supply, fill #1

## 2023-09-11 MED ORDER — ROSUVASTATIN CALCIUM 20 MG PO TABS
20.0000 mg | ORAL_TABLET | Freq: Every evening | ORAL | 1 refills | Status: AC
  Filled 2023-09-11: qty 90, 90d supply, fill #0
  Filled 2023-12-07: qty 90, 90d supply, fill #1

## 2023-10-24 ENCOUNTER — Other Ambulatory Visit (HOSPITAL_COMMUNITY): Payer: Self-pay

## 2023-10-24 MED ORDER — HYDROCODONE BIT-HOMATROP MBR 5-1.5 MG/5ML PO SOLN
5.0000 mL | Freq: Four times a day (QID) | ORAL | 0 refills | Status: AC
  Filled 2023-10-24: qty 120, 6d supply, fill #0

## 2023-10-24 MED ORDER — AZITHROMYCIN 250 MG PO TABS
ORAL_TABLET | ORAL | 0 refills | Status: AC
  Filled 2023-10-24: qty 6, 5d supply, fill #0

## 2023-10-30 ENCOUNTER — Other Ambulatory Visit (HOSPITAL_COMMUNITY): Payer: Self-pay

## 2023-10-30 MED ORDER — PREDNISONE 20 MG PO TABS
20.0000 mg | ORAL_TABLET | Freq: Every day | ORAL | 0 refills | Status: AC
  Filled 2023-10-30: qty 5, 5d supply, fill #0

## 2023-11-09 ENCOUNTER — Other Ambulatory Visit: Payer: Self-pay

## 2023-11-09 ENCOUNTER — Other Ambulatory Visit (HOSPITAL_COMMUNITY): Payer: Self-pay

## 2023-11-09 MED ORDER — DOXYCYCLINE HYCLATE 100 MG PO TABS
100.0000 mg | ORAL_TABLET | Freq: Two times a day (BID) | ORAL | 0 refills | Status: AC
  Filled 2023-11-09: qty 20, 10d supply, fill #0

## 2023-11-11 ENCOUNTER — Other Ambulatory Visit (HOSPITAL_BASED_OUTPATIENT_CLINIC_OR_DEPARTMENT_OTHER): Payer: Self-pay

## 2023-11-11 ENCOUNTER — Other Ambulatory Visit (HOSPITAL_COMMUNITY): Payer: Self-pay

## 2023-11-12 ENCOUNTER — Other Ambulatory Visit (HOSPITAL_COMMUNITY): Payer: Self-pay

## 2023-11-12 ENCOUNTER — Other Ambulatory Visit (HOSPITAL_BASED_OUTPATIENT_CLINIC_OR_DEPARTMENT_OTHER): Payer: Self-pay

## 2023-11-12 MED ORDER — METHYLPREDNISOLONE 4 MG PO TBPK
ORAL_TABLET | ORAL | 0 refills | Status: AC
  Filled 2023-11-12: qty 21, 6d supply, fill #0

## 2023-12-07 ENCOUNTER — Other Ambulatory Visit (HOSPITAL_COMMUNITY): Payer: Self-pay

## 2023-12-17 ENCOUNTER — Other Ambulatory Visit (HOSPITAL_BASED_OUTPATIENT_CLINIC_OR_DEPARTMENT_OTHER): Payer: Self-pay

## 2024-01-26 ENCOUNTER — Other Ambulatory Visit (HOSPITAL_COMMUNITY): Payer: Self-pay

## 2024-02-06 ENCOUNTER — Other Ambulatory Visit (HOSPITAL_COMMUNITY): Payer: Self-pay

## 2024-03-10 ENCOUNTER — Other Ambulatory Visit (HOSPITAL_COMMUNITY): Payer: Self-pay

## 2024-03-10 ENCOUNTER — Other Ambulatory Visit: Payer: Self-pay

## 2024-03-10 MED ORDER — SERTRALINE HCL 200 MG PO CAPS
200.0000 mg | ORAL_CAPSULE | Freq: Every day | ORAL | 1 refills | Status: DC
  Filled 2024-03-10: qty 90, 90d supply, fill #0

## 2024-03-10 MED ORDER — BUPROPION HCL ER (XL) 300 MG PO TB24
300.0000 mg | ORAL_TABLET | Freq: Every morning | ORAL | 1 refills | Status: DC
  Filled 2024-03-10: qty 90, 90d supply, fill #0
  Filled 2024-06-26: qty 90, 90d supply, fill #1

## 2024-03-10 MED ORDER — ROSUVASTATIN CALCIUM 20 MG PO TABS
20.0000 mg | ORAL_TABLET | Freq: Every day | ORAL | 1 refills | Status: DC
  Filled 2024-03-10: qty 90, 90d supply, fill #0
  Filled 2024-06-26: qty 90, 90d supply, fill #1

## 2024-03-10 MED ORDER — SERTRALINE HCL 100 MG PO TABS
200.0000 mg | ORAL_TABLET | Freq: Every morning | ORAL | 1 refills | Status: DC
  Filled 2024-03-10: qty 180, 90d supply, fill #0
  Filled 2024-06-26: qty 180, 90d supply, fill #1

## 2024-04-13 ENCOUNTER — Other Ambulatory Visit (HOSPITAL_BASED_OUTPATIENT_CLINIC_OR_DEPARTMENT_OTHER): Payer: Self-pay

## 2024-04-22 ENCOUNTER — Other Ambulatory Visit (HOSPITAL_COMMUNITY): Payer: Self-pay

## 2024-05-04 ENCOUNTER — Other Ambulatory Visit (HOSPITAL_COMMUNITY): Payer: Self-pay

## 2024-05-04 MED ORDER — ONDANSETRON HCL 8 MG PO TABS
8.0000 mg | ORAL_TABLET | Freq: Three times a day (TID) | ORAL | 0 refills | Status: AC | PRN
  Filled 2024-05-04: qty 15, 5d supply, fill #0

## 2024-05-04 MED ORDER — CLINDAMYCIN HCL 300 MG PO CAPS
600.0000 mg | ORAL_CAPSULE | Freq: Two times a day (BID) | ORAL | 0 refills | Status: AC
  Filled 2024-05-04: qty 20, 5d supply, fill #0

## 2024-07-26 ENCOUNTER — Other Ambulatory Visit (HOSPITAL_COMMUNITY): Payer: Self-pay

## 2024-09-13 ENCOUNTER — Other Ambulatory Visit (HOSPITAL_COMMUNITY): Payer: Self-pay

## 2024-09-13 MED ORDER — BUPROPION HCL ER (XL) 300 MG PO TB24
300.0000 mg | ORAL_TABLET | Freq: Every morning | ORAL | 1 refills | Status: AC
  Filled 2024-09-13: qty 90, 90d supply, fill #0
  Filled 2024-12-08: qty 90, 90d supply, fill #1

## 2024-09-13 MED ORDER — PREDNISONE 10 MG (21) PO TBPK
ORAL_TABLET | ORAL | 0 refills | Status: AC
  Filled 2024-09-13: qty 21, 6d supply, fill #0

## 2024-09-13 MED ORDER — ROSUVASTATIN CALCIUM 20 MG PO TABS
20.0000 mg | ORAL_TABLET | Freq: Every evening | ORAL | 1 refills | Status: AC
  Filled 2024-09-13: qty 90, 90d supply, fill #0
  Filled 2024-12-08: qty 90, 90d supply, fill #1

## 2024-09-13 MED ORDER — VALACYCLOVIR HCL 500 MG PO TABS
ORAL_TABLET | ORAL | 3 refills | Status: AC
  Filled 2024-09-13: qty 90, 90d supply, fill #0
  Filled 2024-12-08: qty 90, 90d supply, fill #1

## 2024-09-13 MED ORDER — SERTRALINE HCL 100 MG PO TABS
200.0000 mg | ORAL_TABLET | Freq: Every morning | ORAL | 1 refills | Status: AC
  Filled 2024-09-13: qty 180, 90d supply, fill #0
  Filled 2024-12-08: qty 180, 90d supply, fill #1

## 2024-09-13 MED ORDER — TADALAFIL 10 MG PO TABS
10.0000 mg | ORAL_TABLET | ORAL | 5 refills | Status: AC | PRN
  Filled 2024-09-13: qty 30, 60d supply, fill #0
  Filled 2024-12-08: qty 30, 60d supply, fill #1

## 2024-11-24 ENCOUNTER — Other Ambulatory Visit (HOSPITAL_BASED_OUTPATIENT_CLINIC_OR_DEPARTMENT_OTHER): Payer: Self-pay

## 2024-12-08 ENCOUNTER — Other Ambulatory Visit (HOSPITAL_COMMUNITY): Payer: Self-pay
